# Patient Record
Sex: Male | Born: 1940
Health system: Southern US, Community
[De-identification: ages and names within clinical notes are randomized; demographics above are authoritative.]

## PROBLEM LIST (undated history)

## (undated) DIAGNOSIS — F419 Anxiety disorder, unspecified: Secondary | ICD-10-CM

## (undated) DIAGNOSIS — E119 Type 2 diabetes mellitus without complications: Secondary | ICD-10-CM

## (undated) DIAGNOSIS — Z87828 Personal history of other (healed) physical injury and trauma: Secondary | ICD-10-CM

## (undated) DIAGNOSIS — E039 Hypothyroidism, unspecified: Secondary | ICD-10-CM

## (undated) DIAGNOSIS — M1990 Unspecified osteoarthritis, unspecified site: Secondary | ICD-10-CM

## (undated) DIAGNOSIS — K219 Gastro-esophageal reflux disease without esophagitis: Secondary | ICD-10-CM

## (undated) DIAGNOSIS — I10 Essential (primary) hypertension: Secondary | ICD-10-CM

## (undated) DIAGNOSIS — C61 Malignant neoplasm of prostate: Secondary | ICD-10-CM

## (undated) DIAGNOSIS — I2510 Atherosclerotic heart disease of native coronary artery without angina pectoris: Secondary | ICD-10-CM

## (undated) DIAGNOSIS — Z860100 Personal history of colon polyps, unspecified: Secondary | ICD-10-CM

## (undated) DIAGNOSIS — L409 Psoriasis, unspecified: Secondary | ICD-10-CM

## (undated) HISTORY — DX: Anxiety disorder, unspecified: F41.9

## (undated) HISTORY — DX: Hypothyroidism, unspecified: E03.9

## (undated) HISTORY — DX: Psoriasis, unspecified: L40.9

## (undated) HISTORY — DX: Essential (primary) hypertension: I10

## (undated) HISTORY — DX: Type 2 diabetes mellitus without complications: E11.9

## (undated) HISTORY — DX: Malignant neoplasm of prostate: C61

## (undated) HISTORY — DX: Personal history of colon polyps, unspecified: Z86.0100

## (undated) HISTORY — DX: Personal history of other (healed) physical injury and trauma: Z87.828

## (undated) HISTORY — DX: Personal history of colonic polyps: Z86.010

## (undated) HISTORY — PX: OTHER SURGICAL HISTORY: SHX169

## (undated) HISTORY — DX: Gastro-esophageal reflux disease without esophagitis: K21.9

---

## 2005-10-09 ENCOUNTER — Ambulatory Visit: Admission: RE | Admit: 2005-10-09 | Discharge: 2006-01-07 | Payer: Self-pay | Admitting: Radiation Oncology

## 2005-10-16 ENCOUNTER — Encounter: Admission: RE | Admit: 2005-10-16 | Discharge: 2005-10-16 | Payer: Self-pay | Admitting: Urology

## 2005-11-27 ENCOUNTER — Ambulatory Visit (HOSPITAL_BASED_OUTPATIENT_CLINIC_OR_DEPARTMENT_OTHER): Admission: RE | Admit: 2005-11-27 | Discharge: 2005-11-27 | Payer: Self-pay | Admitting: Urology

## 2014-07-25 ENCOUNTER — Encounter (INDEPENDENT_AMBULATORY_CARE_PROVIDER_SITE_OTHER): Payer: Self-pay | Admitting: *Deleted

## 2014-07-27 ENCOUNTER — Other Ambulatory Visit (INDEPENDENT_AMBULATORY_CARE_PROVIDER_SITE_OTHER): Payer: Self-pay | Admitting: *Deleted

## 2014-07-27 ENCOUNTER — Encounter (INDEPENDENT_AMBULATORY_CARE_PROVIDER_SITE_OTHER): Payer: Self-pay | Admitting: *Deleted

## 2014-07-27 ENCOUNTER — Telehealth (INDEPENDENT_AMBULATORY_CARE_PROVIDER_SITE_OTHER): Payer: Self-pay | Admitting: *Deleted

## 2014-07-27 ENCOUNTER — Encounter (INDEPENDENT_AMBULATORY_CARE_PROVIDER_SITE_OTHER): Payer: Self-pay

## 2014-07-27 DIAGNOSIS — Z86010 Personal history of colonic polyps: Secondary | ICD-10-CM

## 2014-07-27 DIAGNOSIS — Z1211 Encounter for screening for malignant neoplasm of colon: Secondary | ICD-10-CM

## 2014-07-27 NOTE — Telephone Encounter (Signed)
Patient needs trilyte 

## 2014-07-28 MED ORDER — PEG 3350-KCL-NA BICARB-NACL 420 G PO SOLR: 4000.0000 mL | Freq: Once | ORAL | Status: DC

## 2014-08-09 ENCOUNTER — Telehealth (INDEPENDENT_AMBULATORY_CARE_PROVIDER_SITE_OTHER): Payer: Self-pay | Admitting: *Deleted

## 2014-08-09 NOTE — Telephone Encounter (Signed)
Referring MD/PCP: vyas   Procedure: tcs  Reason/Indication:  Hx polyps  Has patient had this procedure before?  Yes, 2011 -- scanned  If so, when, by whom and where?    Is there a family history of colon cancer?  no  Who?  What age when diagnosed?    Is patient diabetic?   no      Does patient have prosthetic heart valve?  no  Do you have a pacemaker?  no  Has patient ever had endocarditis? no  Has patient had joint replacement within last 12 months?  no  Does patient tend to be constipated or take laxatives? no  Is patient on Coumadin, Plavix and/or Aspirin? yes  Medications: asa 81 mg daily, omeprazole 40 mg daily, levothyroxine 137 mcg daily, clonazepam 0.5 mg prn, triamcinolone ointment bid  Allergies: celexa, cipro, bactrim, serax  Medication Adjustment: asa 2 days  Procedure date & time: 09/08/14 at 930

## 2014-08-10 NOTE — Telephone Encounter (Signed)
agree

## 2014-08-12 ENCOUNTER — Encounter: Payer: Self-pay | Admitting: Cardiology

## 2014-08-12 ENCOUNTER — Ambulatory Visit (INDEPENDENT_AMBULATORY_CARE_PROVIDER_SITE_OTHER): Payer: Medicare Other | Admitting: Cardiology

## 2014-08-12 VITALS — BP 140/80 | HR 69 | Ht 72.0 in | Wt 184.0 lb

## 2014-08-12 DIAGNOSIS — E119 Type 2 diabetes mellitus without complications: Secondary | ICD-10-CM | POA: Insufficient documentation

## 2014-08-12 DIAGNOSIS — R002 Palpitations: Secondary | ICD-10-CM

## 2014-08-12 DIAGNOSIS — I10 Essential (primary) hypertension: Secondary | ICD-10-CM | POA: Insufficient documentation
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
## 2014-08-12 NOTE — Patient Instructions (Signed)
Your physician has recommended that you wear a 7 day event monitor. Event monitors are medical devices that record the heart's electrical activity. Doctors most often [place] these monitors to diagnose arrhythmias. Arrhythmias are problems with the speed or rhythm of the heartbeat. The monitor is a small, portable device. You can wear one while you do your normal daily activities. This is usually used to diagnose what is causing palpitations/syncope (passing out). Office will contact with results via phone or letter.   Continue all current medications. Follow up based on results.

## 2014-08-16 ENCOUNTER — Other Ambulatory Visit: Payer: Self-pay | Admitting: *Deleted

## 2014-08-16 DIAGNOSIS — R002 Palpitations: Secondary | ICD-10-CM

## 2014-09-08 ENCOUNTER — Ambulatory Visit (HOSPITAL_COMMUNITY): Admission: RE | Admit: 2014-09-08 | Payer: Medicare Other | Source: Ambulatory Visit | Admitting: Internal Medicine

## 2014-09-08 ENCOUNTER — Encounter (HOSPITAL_COMMUNITY): Admission: RE | Payer: Self-pay | Source: Ambulatory Visit

## 2014-09-08 SURGERY — COLONOSCOPY
Anesthesia: Moderate Sedation

## 2015-07-28 ENCOUNTER — Encounter (INDEPENDENT_AMBULATORY_CARE_PROVIDER_SITE_OTHER): Payer: Self-pay | Admitting: *Deleted

## 2015-08-03 ENCOUNTER — Other Ambulatory Visit (INDEPENDENT_AMBULATORY_CARE_PROVIDER_SITE_OTHER): Payer: Self-pay | Admitting: *Deleted

## 2015-08-03 DIAGNOSIS — Z86010 Personal history of colonic polyps: Secondary | ICD-10-CM

## 2015-08-29 ENCOUNTER — Other Ambulatory Visit (INDEPENDENT_AMBULATORY_CARE_PROVIDER_SITE_OTHER): Payer: Self-pay | Admitting: *Deleted

## 2015-08-29 ENCOUNTER — Encounter (INDEPENDENT_AMBULATORY_CARE_PROVIDER_SITE_OTHER): Payer: Self-pay | Admitting: *Deleted

## 2015-08-29 MED ORDER — PEG 3350-KCL-NA BICARB-NACL 420 G PO SOLR: 4000.0000 mL | Freq: Once | ORAL | Status: DC

## 2015-08-29 NOTE — Telephone Encounter (Signed)
Patient needs trilyte 

## 2015-09-21 ENCOUNTER — Telehealth (INDEPENDENT_AMBULATORY_CARE_PROVIDER_SITE_OTHER): Payer: Self-pay | Admitting: *Deleted

## 2015-09-21 NOTE — Telephone Encounter (Signed)
Referring MD/PCP: vyas   Procedure: tcs  Reason/Indication:  Hx polyps  Has patient had this procedure before?  Yes, 2011 -- scanned  If so, when, by whom and where?    Is there a family history of colon cancer?  nop  Who?  What age when diagnosed?    Is patient diabetic?   no      Does patient have prosthetic heart valve or mechanical valve?  no  Do you have a pacemaker?  no  Has patient ever had endocarditis? no  Has patient had joint replacement within last 12 months?  no  Does patient tend to be constipated or take laxatives? no  Does patient have a history of alcohol/drug use?  no  Is patient on Coumadin, Plavix and/or Aspirin? no  Medications: levothyroxine 137 mcg daily  Allergies: serac, bactrim, celexa, cipro  Medication Adjustment:   Procedure date & time: 10/19/15 at 830

## 2015-09-21 NOTE — Telephone Encounter (Signed)
agree

## 2015-10-19 ENCOUNTER — Encounter (HOSPITAL_COMMUNITY): Payer: Self-pay | Admitting: *Deleted

## 2015-10-19 ENCOUNTER — Encounter (HOSPITAL_COMMUNITY)
Admission: RE | Disposition: A | Discharge status: Home / Self Care | Payer: Self-pay | Source: Ambulatory Visit | Attending: Internal Medicine

## 2015-10-19 ENCOUNTER — Ambulatory Visit (HOSPITAL_COMMUNITY)
Admission: RE | Admit: 2015-10-19 | Discharge: 2015-10-19 | Disposition: A | Discharge status: Home / Self Care | Payer: Medicare Other | Source: Ambulatory Visit | Attending: Internal Medicine | Admitting: Internal Medicine

## 2015-10-19 DIAGNOSIS — Z1211 Encounter for screening for malignant neoplasm of colon: Secondary | ICD-10-CM | POA: Diagnosis not present

## 2015-10-19 DIAGNOSIS — F419 Anxiety disorder, unspecified: Secondary | ICD-10-CM | POA: Insufficient documentation

## 2015-10-19 DIAGNOSIS — I10 Essential (primary) hypertension: Secondary | ICD-10-CM | POA: Insufficient documentation

## 2015-10-19 DIAGNOSIS — K219 Gastro-esophageal reflux disease without esophagitis: Secondary | ICD-10-CM | POA: Diagnosis not present

## 2015-10-19 DIAGNOSIS — Z79899 Other long term (current) drug therapy: Secondary | ICD-10-CM | POA: Diagnosis not present

## 2015-10-19 DIAGNOSIS — E039 Hypothyroidism, unspecified: Secondary | ICD-10-CM | POA: Diagnosis not present

## 2015-10-19 DIAGNOSIS — E119 Type 2 diabetes mellitus without complications: Secondary | ICD-10-CM | POA: Diagnosis not present

## 2015-10-19 DIAGNOSIS — Z8546 Personal history of malignant neoplasm of prostate: Secondary | ICD-10-CM | POA: Diagnosis not present

## 2015-10-19 DIAGNOSIS — Z86010 Personal history of colonic polyps: Secondary | ICD-10-CM

## 2015-10-19 DIAGNOSIS — K648 Other hemorrhoids: Secondary | ICD-10-CM | POA: Diagnosis not present

## 2015-10-19 DIAGNOSIS — Z09 Encounter for follow-up examination after completed treatment for conditions other than malignant neoplasm: Secondary | ICD-10-CM | POA: Diagnosis not present

## 2015-10-19 HISTORY — PX: COLONOSCOPY: SHX5424

## 2015-10-19 SURGERY — COLONOSCOPY
Anesthesia: Moderate Sedation

## 2015-10-19 MED ORDER — SODIUM CHLORIDE 0.9 % IV SOLN
INTRAVENOUS | Status: DC
  Administered 2015-10-19: 08:00:00 via INTRAVENOUS

## 2015-10-19 MED ORDER — MIDAZOLAM HCL 5 MG/5ML IJ SOLN
INTRAMUSCULAR | Status: AC
  Filled 2015-10-19: qty 10

## 2015-10-19 MED ORDER — MEPERIDINE HCL 50 MG/ML IJ SOLN
INTRAMUSCULAR | Status: AC
  Filled 2015-10-19: qty 1

## 2015-10-19 MED ORDER — MEPERIDINE HCL 50 MG/ML IJ SOLN
INTRAMUSCULAR | Status: DC | PRN
  Administered 2015-10-19 (×2): 25 mg via INTRAVENOUS

## 2015-10-19 MED ORDER — STERILE WATER FOR IRRIGATION IR SOLN
Status: DC | PRN
  Administered 2015-10-19: 09:00:00

## 2015-10-19 MED ORDER — MIDAZOLAM HCL 5 MG/5ML IJ SOLN
INTRAMUSCULAR | Status: DC | PRN
  Administered 2015-10-19 (×3): 2 mg via INTRAVENOUS

## 2015-10-19 NOTE — Discharge Instructions (Signed)
Resume usual medications and diet. No driving for 24 hours per   Colonoscopy, Care After These instructions give you information on caring for yourself after your procedure. Your doctor may also give you more specific instructions. Call your doctor if you have any problems or questions after your procedure. HOME CARE  Do not drive for 24 hours.  Do not sign important papers or use machinery for 24 hours.  You may shower.  You may go back to your usual activities, but go slower for the first 24 hours.  Take rest breaks often during the first 24 hours.  Walk around or use warm packs on your belly (abdomen) if you have belly cramping or gas.  Drink enough fluids to keep your pee (urine) clear or pale yellow.  Resume your normal diet. Avoid heavy or fried foods.  Avoid drinking alcohol for 24 hours or as told by your doctor.  Only take medicines as told by your doctor. If a tissue sample (biopsy) was taken during the procedure:   Do not take aspirin or blood thinners for 7 days, or as told by your doctor.  Do not drink alcohol for 7 days, or as told by your doctor.  Eat soft foods for the first 24 hours. GET HELP IF: You still have a small amount of blood in your poop (stool) 2-3 days after the procedure. GET HELP RIGHT AWAY IF:  You have more than a small amount of blood in your poop.  You see clumps of tissue (blood clots) in your poop.  Your belly is puffy (swollen).  You feel sick to your stomach (nauseous) or throw up (vomit).  You have a fever.  You have belly pain that gets worse and medicine does not help. MAKE SURE YOU:  Understand these instructions.  Will watch your condition.  Will get help right away if you are not doing well or get worse.   This information is not intended to replace advice given to you by your health care provider. Make sure you discuss any questions you have with your health care provider.   Document Released: 07/13/2010 Document  Revised: 06/15/2013 Document Reviewed: 02/15/2013 Elsevier Interactive Patient Education 2016 [employer].   Hemorrhoids Hemorrhoids are swollen veins around the rectum or anus. There are two types of hemorrhoids:   Internal hemorrhoids. These occur in the veins just inside the rectum. They may poke through to the outside and become irritated and painful.  External hemorrhoids. These occur in the veins outside the anus and can be felt as a painful swelling or hard lump near the anus. CAUSES  Pregnancy.   Obesity.   Constipation or diarrhea.   Straining to have a bowel movement.   Sitting for long periods on the toilet.  Heavy lifting or other activity that caused you to strain.  Anal intercourse. SYMPTOMS   Pain.   Anal itching or irritation.   Rectal bleeding.   Fecal leakage.   Anal swelling.   One or more lumps around the anus.  DIAGNOSIS  Your caregiver may be able to diagnose hemorrhoids by visual examination. Other examinations or tests that may be performed include:   Examination of the rectal area with a gloved hand (digital rectal exam).   Examination of anal canal using a small tube (scope).   A blood test if you have lost a significant amount of blood.  A test to look inside the colon (sigmoidoscopy or colonoscopy). TREATMENT Most hemorrhoids can be treated at home.  However, if symptoms do not seem to be getting better or if you have a lot of rectal bleeding, your caregiver may perform a procedure to help make the hemorrhoids get smaller or remove them completely. Possible treatments include:   Placing a rubber band at the base of the hemorrhoid to cut off the circulation (rubber band ligation).   Injecting a chemical to shrink the hemorrhoid (sclerotherapy).   Using a tool to burn the hemorrhoid (infrared light therapy).   Surgically removing the hemorrhoid (hemorrhoidectomy).   Stapling the hemorrhoid to block blood flow to the  tissue (hemorrhoid stapling).  HOME CARE INSTRUCTIONS   Eat foods with fiber, such as whole grains, beans, nuts, fruits, and vegetables. Ask your doctor about taking products with added fiber in them (fibersupplements).  Increase fluid intake. Drink enough water and fluids to keep your urine clear or pale yellow.   Exercise regularly.   Go to the bathroom when you have the urge to have a bowel movement. Do not wait.   Avoid straining to have bowel movements.   Keep the anal area dry and clean. Use wet toilet paper or moist towelettes after a bowel movement.   Medicated creams and suppositories may be used or applied as directed.   Only take over-the-counter or prescription medicines as directed by your caregiver.   Take warm sitz baths for 15-20 minutes, 3-4 times a day to ease pain and discomfort.   Place ice packs on the hemorrhoids if they are tender and swollen. Using ice packs between sitz baths may be helpful.   Put ice in a plastic bag.   Place a towel between your skin and the bag.   Leave the ice on for 15-20 minutes, 3-4 times a day.   Do not use a donut-shaped pillow or sit on the toilet for long periods. This increases blood pooling and pain.  SEEK MEDICAL CARE IF:  You have increasing pain and swelling that is not controlled by treatment or medicine.  You have uncontrolled bleeding.  You have difficulty or you are unable to have a bowel movement.  You have pain or inflammation outside the area of the hemorrhoids. MAKE SURE YOU:  Understand these instructions.  Will watch your condition.  Will get help right away if you are not doing well or get worse.   This information is not intended to replace advice given to you by your health care provider. Make sure you discuss any questions you have with your health care provider.   Document Released: 06/07/2000 Document Revised: 05/27/2012 Document Reviewed: 04/14/2012 Elsevier Interactive Patient  Education [employer].

## 2015-10-19 NOTE — Op Note (Signed)
[facility] Patient Name: Herbert Carr Procedure Date: 10/19/2015 8:52 AM MRN: [id number] Date of Birth: 1941-01-11 Attending MD: Hildred Laser , MD CSN: [id number] Age: 75 Admit Type: Outpatient Procedure:                Colonoscopy Indications:              Surveillance: Personal history of adenomatous                            polyps on last colonoscopy > 5 years ago Providers:                Hildred Laser, MD, Renda Rolls, RN, Isabella Stalling,                            Technician Referring MD:             Glenda Chroman Medicines:                Meperidine 50 mg IV, Midazolam 6 mg IV Complications:            No immediate complications. Estimated Blood Loss:     Estimated blood loss: none. Procedure:                Pre-Anesthesia Assessment:                           - Prior to the procedure, a History and Physical                            was performed, and patient medications and                            allergies were reviewed. The patient's tolerance of                            previous anesthesia was also reviewed. The risks                            and benefits of the procedure and the sedation                            options and risks were discussed with the patient.                            All questions were answered, and informed consent                            was obtained. Prior Anticoagulants: The patient has                            taken no previous anticoagulant or antiplatelet                            agents. ASA Grade Assessment: II - A patient with  mild systemic disease. After reviewing the risks                            and benefits, the patient was deemed in                            satisfactory condition to undergo the procedure.                           After obtaining informed consent, the colonoscope                            was passed under direct vision. Throughout the   procedure, the patient's blood pressure, pulse, and                            oxygen saturations were monitored continuously. The                            [id number]) scope was introduced through                            the anus and advanced to the the cecum, identified                            by appendiceal orifice and ileocecal valve. The                            colonoscopy was performed without difficulty. The                            patient tolerated the procedure well. The quality                            of the bowel preparation was adequate. The                            ileocecal valve, appendiceal orifice, and rectum                            were photographed. Scope In: 9:00:54 AM Scope Out: 9:16:58 AM Scope Withdrawal Time: 0 hours 9 minutes 9 seconds  Total Procedure Duration: 0 hours 16 minutes 4 seconds  Findings:      The colon (entire examined portion) appeared normal.      Internal hemorrhoids were found during retroflexion. The hemorrhoids       were medium-sized.      The digital rectal exam was normal. Impression:               - The entire examined colon is normal.                           - Internal hemorrhoids.                           - No  specimens collected. Moderate Sedation:      Moderate (conscious) sedation was administered by the endoscopy nurse       and supervised by the endoscopist. The following parameters were       monitored: oxygen saturation, heart rate, blood pressure, CO2       capnography and response to care. Total physician intraservice time was       21 minutes. Recommendation:           - Patient has a contact number available for                            emergencies. The signs and symptoms of potential                            delayed complications were discussed with the                            patient. Return to normal activities tomorrow.                            Written discharge instructions  were provided to the                            patient.                           - Resume previous diet today.                           - Continue present medications.                           - Repeat colonoscopy is not recommended for                            surveillance. Procedure Code(s):        --- Professional ---                           [id number], Colonoscopy, flexible; diagnostic, including                            collection of specimen(s) by brushing or washing,                            when performed (separate procedure)                           99152, Moderate sedation services provided by the                            same physician or other qualified health care                            professional performing the diagnostic or  therapeutic service that the sedation supports,                            requiring the presence of an independent trained                            observer to assist in the monitoring of the                            patient's level of consciousness and physiological                            status; initial 15 minutes of intraservice time,                            patient age 67 years or older Diagnosis Code(s):        --- Professional ---                           Z86.010, Personal history of colonic polyps                           K64.8, Other hemorrhoids CPT copyright 2016 American Medical Association. All rights reserved. The codes documented in this report are preliminary and upon coder review may  be revised to meet current compliance requirements. Hildred Laser, MD Hildred Laser, MD 10/19/2015 9:26:42 AM This report has been signed electronically. Number of Addenda: 0
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
## 2015-10-23 ENCOUNTER — Encounter (HOSPITAL_COMMUNITY): Payer: Self-pay | Admitting: Internal Medicine

## 2017-03-22 ENCOUNTER — Emergency Department (HOSPITAL_COMMUNITY): Payer: Medicare HMO

## 2017-03-22 ENCOUNTER — Emergency Department (HOSPITAL_COMMUNITY)
Admission: EM | Admit: 2017-03-22 | Discharge: 2017-03-22 | Disposition: A | Discharge status: Home / Self Care | Payer: Medicare HMO | Source: Home / Self Care | Attending: Emergency Medicine | Admitting: Emergency Medicine

## 2017-03-22 ENCOUNTER — Encounter (HOSPITAL_COMMUNITY): Payer: Self-pay

## 2017-03-22 DIAGNOSIS — E039 Hypothyroidism, unspecified: Secondary | ICD-10-CM | POA: Insufficient documentation

## 2017-03-22 DIAGNOSIS — Z8546 Personal history of malignant neoplasm of prostate: Secondary | ICD-10-CM | POA: Insufficient documentation

## 2017-03-22 DIAGNOSIS — R0789 Other chest pain: Secondary | ICD-10-CM | POA: Insufficient documentation

## 2017-03-22 DIAGNOSIS — E119 Type 2 diabetes mellitus without complications: Secondary | ICD-10-CM | POA: Insufficient documentation

## 2017-03-22 DIAGNOSIS — I10 Essential (primary) hypertension: Secondary | ICD-10-CM | POA: Insufficient documentation

## 2017-03-22 DIAGNOSIS — I214 Non-ST elevation (NSTEMI) myocardial infarction: Secondary | ICD-10-CM | POA: Diagnosis not present

## 2017-03-22 LAB — CBC WITH DIFFERENTIAL/PLATELET
Basophils Absolute: 0.1 10*3/uL (ref 0.0–0.1)
Basophils Relative: 1 %
EOS ABS: 0.4 10*3/uL (ref 0.0–0.7)
Eosinophils Relative: 7 %
HCT: 42.5 % (ref 39.0–52.0)
HEMOGLOBIN: 14.7 g/dL (ref 13.0–17.0)
LYMPHS ABS: 1.6 10*3/uL (ref 0.7–4.0)
Lymphocytes Relative: 28 %
MCH: 31.3 pg (ref 26.0–34.0)
MCHC: 34.6 g/dL (ref 30.0–36.0)
MCV: 90.4 fL (ref 78.0–100.0)
MONOS PCT: 8 %
Monocytes Absolute: 0.5 10*3/uL (ref 0.1–1.0)
NEUTROS PCT: 56 %
Neutro Abs: 3.2 10*3/uL (ref 1.7–7.7)
Platelets: 153 10*3/uL (ref 150–400)
RBC: 4.7 MIL/uL (ref 4.22–5.81)
RDW: 12.4 % (ref 11.5–15.5)
WBC: 5.7 10*3/uL (ref 4.0–10.5)

## 2017-03-22 LAB — COMPREHENSIVE METABOLIC PANEL
ALK PHOS: 63 U/L (ref 38–126)
ALT: 28 U/L (ref 17–63)
ANION GAP: 8 (ref 5–15)
AST: 27 U/L (ref 15–41)
Albumin: 4.5 g/dL (ref 3.5–5.0)
BILIRUBIN TOTAL: 0.8 mg/dL (ref 0.3–1.2)
BUN: 12 mg/dL (ref 6–20)
CALCIUM: 9.3 mg/dL (ref 8.9–10.3)
CO2: 28 mmol/L (ref 22–32)
Chloride: 106 mmol/L (ref 101–111)
Creatinine, Ser: 0.68 mg/dL (ref 0.61–1.24)
GFR calc non Af Amer: >60 mL/min (ref >60)
Glucose, Bld: 123 mg/dL — ABNORMAL HIGH (ref 65–99)
Potassium: 3.1 mmol/L — ABNORMAL LOW (ref 3.5–5.1)
SODIUM: 142 mmol/L (ref 135–145)
TOTAL PROTEIN: 8 g/dL (ref 6.5–8.1)

## 2017-03-22 LAB — TROPONIN I: Troponin I: <0.03 ng/mL (ref <0.03)

## 2017-03-22 LAB — LIPASE, BLOOD: LIPASE: 29 U/L (ref 11–51)

## 2017-03-22 NOTE — ED Triage Notes (Signed)
Pt states he has had mid sternal chest pain for a few days, saw his pmd yesterday and was given ranitidine.  Pt states the pain is worse today and is going through to his back.  Pt reports he was sweaty this evening when the pain worsened

## 2017-03-22 NOTE — Discharge Instructions (Signed)
Continue the zantac as prescribed by your doctor. You can also take simethicone (gaviscon) OTC for gas and bloating. Look at the diet recommendations and avoid things that can make you worse. Call the Radiology Schedule number to get an outpatient ultrasound of your gall bladder scheduled.  Call Dr Olevia Perches office on Monday to get an appointment to have him evaluate your pain. You may have a gallbladder problem or duodenal ulcer problem. If you get chest pain while exercising or if the pain lasts more than 30 minutes, you should call 911.
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
## 2017-03-25 ENCOUNTER — Emergency Department (HOSPITAL_COMMUNITY): Payer: Medicare HMO

## 2017-03-25 ENCOUNTER — Inpatient Hospital Stay (HOSPITAL_COMMUNITY)
Admission: EM | Admit: 2017-03-25 | Discharge: 2017-03-27 | DRG: 247 | Disposition: A | Discharge status: Home / Self Care | Payer: Medicare HMO | Attending: Cardiology | Admitting: Cardiology

## 2017-03-25 ENCOUNTER — Encounter (HOSPITAL_COMMUNITY): Payer: Self-pay | Admitting: Emergency Medicine

## 2017-03-25 DIAGNOSIS — E039 Hypothyroidism, unspecified: Secondary | ICD-10-CM | POA: Diagnosis present

## 2017-03-25 DIAGNOSIS — H5462 Unqualified visual loss, left eye, normal vision right eye: Secondary | ICD-10-CM | POA: Diagnosis present

## 2017-03-25 DIAGNOSIS — E119 Type 2 diabetes mellitus without complications: Secondary | ICD-10-CM | POA: Diagnosis present

## 2017-03-25 DIAGNOSIS — Z951 Presence of aortocoronary bypass graft: Secondary | ICD-10-CM

## 2017-03-25 DIAGNOSIS — Z8546 Personal history of malignant neoplasm of prostate: Secondary | ICD-10-CM

## 2017-03-25 DIAGNOSIS — K219 Gastro-esophageal reflux disease without esophagitis: Secondary | ICD-10-CM | POA: Diagnosis present

## 2017-03-25 DIAGNOSIS — Z923 Personal history of irradiation: Secondary | ICD-10-CM

## 2017-03-25 DIAGNOSIS — F419 Anxiety disorder, unspecified: Secondary | ICD-10-CM | POA: Diagnosis present

## 2017-03-25 DIAGNOSIS — R002 Palpitations: Secondary | ICD-10-CM | POA: Diagnosis present

## 2017-03-25 DIAGNOSIS — I341 Nonrheumatic mitral (valve) prolapse: Secondary | ICD-10-CM | POA: Diagnosis not present

## 2017-03-25 DIAGNOSIS — I10 Essential (primary) hypertension: Secondary | ICD-10-CM | POA: Diagnosis present

## 2017-03-25 DIAGNOSIS — Z9689 Presence of other specified functional implants: Secondary | ICD-10-CM | POA: Diagnosis present

## 2017-03-25 DIAGNOSIS — E038 Other specified hypothyroidism: Secondary | ICD-10-CM | POA: Diagnosis not present

## 2017-03-25 DIAGNOSIS — Z833 Family history of diabetes mellitus: Secondary | ICD-10-CM

## 2017-03-25 DIAGNOSIS — Z79899 Other long term (current) drug therapy: Secondary | ICD-10-CM | POA: Diagnosis not present

## 2017-03-25 DIAGNOSIS — Z7952 Long term (current) use of systemic steroids: Secondary | ICD-10-CM

## 2017-03-25 DIAGNOSIS — R9431 Abnormal electrocardiogram [ECG] [EKG]: Secondary | ICD-10-CM | POA: Diagnosis not present

## 2017-03-25 DIAGNOSIS — I2510 Atherosclerotic heart disease of native coronary artery without angina pectoris: Secondary | ICD-10-CM | POA: Diagnosis present

## 2017-03-25 DIAGNOSIS — I214 Non-ST elevation (NSTEMI) myocardial infarction: Secondary | ICD-10-CM | POA: Diagnosis present

## 2017-03-25 DIAGNOSIS — Z955 Presence of coronary angioplasty implant and graft: Secondary | ICD-10-CM

## 2017-03-25 DIAGNOSIS — Z8249 Family history of ischemic heart disease and other diseases of the circulatory system: Secondary | ICD-10-CM | POA: Diagnosis not present

## 2017-03-25 HISTORY — DX: Unspecified osteoarthritis, unspecified site: M19.90

## 2017-03-25 HISTORY — DX: Atherosclerotic heart disease of native coronary artery without angina pectoris: I25.10

## 2017-03-25 LAB — CBC
HEMATOCRIT: 41 % (ref 39.0–52.0)
Hemoglobin: 14 g/dL (ref 13.0–17.0)
MCH: 31 pg (ref 26.0–34.0)
MCHC: 34.1 g/dL (ref 30.0–36.0)
MCV: 90.7 fL (ref 78.0–100.0)
Platelets: 184 10*3/uL (ref 150–400)
RBC: 4.52 MIL/uL (ref 4.22–5.81)
RDW: 12.6 % (ref 11.5–15.5)
WBC: 16.2 10*3/uL — AB (ref 4.0–10.5)

## 2017-03-25 LAB — BASIC METABOLIC PANEL
ANION GAP: 5 (ref 5–15)
BUN: 9 mg/dL (ref 6–20)
CO2: 29 mmol/L (ref 22–32)
Calcium: 9.1 mg/dL (ref 8.9–10.3)
Chloride: 100 mmol/L — ABNORMAL LOW (ref 101–111)
Creatinine, Ser: 0.73 mg/dL (ref 0.61–1.24)
Glucose, Bld: 142 mg/dL — ABNORMAL HIGH (ref 65–99)
POTASSIUM: 3.8 mmol/L (ref 3.5–5.1)
Sodium: 134 mmol/L — ABNORMAL LOW (ref 135–145)

## 2017-03-25 LAB — I-STAT TROPONIN, ED: Troponin i, poc: 4.32 ng/mL (ref 0.00–0.08)

## 2017-03-25 MED ORDER — HEPARIN (PORCINE) IN NACL 100-0.45 UNIT/ML-% IJ SOLN
1200.0000 [IU]/h | INTRAMUSCULAR | Status: DC
  Administered 2017-03-25: 950 [IU]/h via INTRAVENOUS
  Administered 2017-03-26: 1200 [IU]/h via INTRAVENOUS
  Filled 2017-03-25: qty 250

## 2017-03-25 MED ORDER — IOPAMIDOL (ISOVUE-370) INJECTION 76%
INTRAVENOUS | Status: AC
  Filled 2017-03-25: qty 100

## 2017-03-25 MED ORDER — IOPAMIDOL (ISOVUE-370) INJECTION 76%
INTRAVENOUS | Status: AC
  Administered 2017-03-25: 100 mL
  Filled 2017-03-25: qty 100

## 2017-03-25 MED ORDER — ASPIRIN 81 MG PO CHEW
324.0000 mg | CHEWABLE_TABLET | Freq: Once | ORAL | Status: AC
Start: 2017-03-25 — End: 2017-03-25
  Administered 2017-03-25: 324 mg via ORAL
  Filled 2017-03-25: qty 4

## 2017-03-25 MED ORDER — HEPARIN BOLUS VIA INFUSION
4000.0000 [IU] | Freq: Once | INTRAVENOUS | Status: AC
  Administered 2017-03-25: 4000 [IU] via INTRAVENOUS
  Filled 2017-03-25: qty 4000

## 2017-03-25 NOTE — ED Notes (Signed)
Troponin = 4.32. Dr. Thomasene Lot notified.
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
## 2017-03-25 NOTE — ED Notes (Signed)
Pt. Returned from CT via stretcher. 

## 2017-03-25 NOTE — ED Notes (Signed)
Pt. To CT via stretcher. 

## 2017-03-25 NOTE — ED Triage Notes (Signed)
Pt to ED with c/o central chest pan and mid upper back pain off and on x's 2 weeks.  St's pain increases after eating.  Pt also st's he is belching a lot.

## 2017-03-25 NOTE — Progress Notes (Signed)
ANTICOAGULATION CONSULT NOTE  Pharmacy Consult for heparin Indication: chest pain/ACS  Heparin Dosing Weight: 77.1 kg   Assessment: 47 yom presenting with CP. Pharmacy consulted to dose heparin for ACS. Not on anticoagulation PTA. CBC wnl. No bleed documented.  Goal of Therapy:  Heparin level 0.3-0.7 units/ml Monitor platelets by anticoagulation protocol: Yes   Plan:  Heparin 4000 unit bolus Start heparin at 950 units/h 8h heparin level Daily heparin level/CBC Monitor s/sx bleeding   Elicia Lamp, PharmD, BCPS Clinical Pharmacist 03/25/2017 10:23 PM

## 2017-03-26 ENCOUNTER — Other Ambulatory Visit: Payer: Self-pay

## 2017-03-26 ENCOUNTER — Encounter (HOSPITAL_COMMUNITY): Payer: Self-pay | Admitting: Cardiovascular Disease

## 2017-03-26 ENCOUNTER — Inpatient Hospital Stay (HOSPITAL_COMMUNITY): Payer: Medicare HMO

## 2017-03-26 ENCOUNTER — Inpatient Hospital Stay (HOSPITAL_COMMUNITY)
Admission: EM | Disposition: A | Discharge status: Home / Self Care | Payer: Self-pay | Source: Home / Self Care | Attending: Cardiology

## 2017-03-26 DIAGNOSIS — E038 Other specified hypothyroidism: Secondary | ICD-10-CM

## 2017-03-26 DIAGNOSIS — K219 Gastro-esophageal reflux disease without esophagitis: Secondary | ICD-10-CM | POA: Diagnosis present

## 2017-03-26 DIAGNOSIS — I214 Non-ST elevation (NSTEMI) myocardial infarction: Principal | ICD-10-CM

## 2017-03-26 DIAGNOSIS — R002 Palpitations: Secondary | ICD-10-CM

## 2017-03-26 DIAGNOSIS — R9431 Abnormal electrocardiogram [ECG] [EKG]: Secondary | ICD-10-CM

## 2017-03-26 DIAGNOSIS — I10 Essential (primary) hypertension: Secondary | ICD-10-CM

## 2017-03-26 DIAGNOSIS — I341 Nonrheumatic mitral (valve) prolapse: Secondary | ICD-10-CM

## 2017-03-26 DIAGNOSIS — E039 Hypothyroidism, unspecified: Secondary | ICD-10-CM | POA: Diagnosis present

## 2017-03-26 HISTORY — PX: LEFT HEART CATH AND CORONARY ANGIOGRAPHY: CATH118249

## 2017-03-26 HISTORY — PX: CORONARY STENT INTERVENTION: CATH118234

## 2017-03-26 LAB — ECHOCARDIOGRAM COMPLETE
AOASC: 36 cm
CHL CUP MV DEC (S): 275
E decel time: 275 msec
FS: 38 % (ref 28–44)
Height: 72 in
IVS/LV PW RATIO, ED: 1.3
LA diam index: 1.99 cm/m2
LA vol A4C: 53.9 ml
LA vol index: 31.2 mL/m2
LA vol: 62.7 mL
LASIZE: 40 mm
LDCA: 3.14 cm2
LEFT ATRIUM END SYS DIAM: 40 mm
LV e' LATERAL: 8.92 cm/s
LVOTD: 20 mm
MV pk E vel: 1.1 m/s
PW: 10 mm — AB (ref 0.6–1.1)
RV LATERAL S' VELOCITY: 11.9 cm/s
TAPSE: 22.3 mm
TDI e' lateral: 8.92
TDI e' medial: 7.18
Weight: 2798.4 oz

## 2017-03-26 LAB — CBC
HEMATOCRIT: 39.9 % (ref 39.0–52.0)
Hemoglobin: 13.7 g/dL (ref 13.0–17.0)
MCH: 31.2 pg (ref 26.0–34.0)
MCHC: 34.3 g/dL (ref 30.0–36.0)
MCV: 90.9 fL (ref 78.0–100.0)
PLATELETS: 184 10*3/uL (ref 150–400)
RBC: 4.39 MIL/uL (ref 4.22–5.81)
RDW: 12.7 % (ref 11.5–15.5)
WBC: 14.5 10*3/uL — ABNORMAL HIGH (ref 4.0–10.5)

## 2017-03-26 LAB — COMPREHENSIVE METABOLIC PANEL
ALK PHOS: 59 U/L (ref 38–126)
ALT: 36 U/L (ref 17–63)
AST: 119 U/L — ABNORMAL HIGH (ref 15–41)
Albumin: 3.7 g/dL (ref 3.5–5.0)
Anion gap: 11 (ref 5–15)
BILIRUBIN TOTAL: 0.7 mg/dL (ref 0.3–1.2)
BUN: 7 mg/dL (ref 6–20)
CALCIUM: 8.8 mg/dL — AB (ref 8.9–10.3)
CO2: 28 mmol/L (ref 22–32)
CREATININE: 0.71 mg/dL (ref 0.61–1.24)
Chloride: 99 mmol/L — ABNORMAL LOW (ref 101–111)
Glucose, Bld: 126 mg/dL — ABNORMAL HIGH (ref 65–99)
Potassium: 3.3 mmol/L — ABNORMAL LOW (ref 3.5–5.1)
Sodium: 138 mmol/L (ref 135–145)
Total Protein: 6.8 g/dL (ref 6.5–8.1)

## 2017-03-26 LAB — BRAIN NATRIURETIC PEPTIDE: B NATRIURETIC PEPTIDE 5: 265.8 pg/mL — AB (ref 0.0–100.0)

## 2017-03-26 LAB — TSH: TSH: 1.438 u[IU]/mL (ref 0.350–4.500)

## 2017-03-26 LAB — LIPID PANEL
Cholesterol: 117 mg/dL (ref 0–200)
HDL: 36 mg/dL — AB (ref >40)
LDL CALC: 69 mg/dL (ref 0–99)
Total CHOL/HDL Ratio: 3.3 RATIO
Triglycerides: 58 mg/dL (ref <150)
VLDL: 12 mg/dL (ref 0–40)

## 2017-03-26 LAB — GLUCOSE, CAPILLARY: Glucose-Capillary: 136 mg/dL — ABNORMAL HIGH (ref 65–99)

## 2017-03-26 LAB — HEPARIN LEVEL (UNFRACTIONATED): HEPARIN UNFRACTIONATED: 0.11 [IU]/mL — AB (ref 0.30–0.70)

## 2017-03-26 LAB — PROTIME-INR
INR: 1.11
PROTHROMBIN TIME: 14.3 s (ref 11.4–15.2)

## 2017-03-26 LAB — TROPONIN I
TROPONIN I: 9.37 ng/mL — AB (ref <0.03)
Troponin I: >65 ng/mL (ref <0.03)

## 2017-03-26 LAB — POCT ACTIVATED CLOTTING TIME: ACTIVATED CLOTTING TIME: 340 s

## 2017-03-26 LAB — HEMOGLOBIN A1C
Hgb A1c MFr Bld: 5.3 % (ref 4.8–5.6)
Mean Plasma Glucose: 105.41 mg/dL

## 2017-03-26 SURGERY — LEFT HEART CATH AND CORONARY ANGIOGRAPHY
Anesthesia: LOCAL

## 2017-03-26 MED ORDER — METOPROLOL TARTRATE 12.5 MG HALF TABLET
12.5000 mg | ORAL_TABLET | Freq: Two times a day (BID) | ORAL | Status: DC
  Administered 2017-03-26 – 2017-03-27 (×2): 12.5 mg via ORAL
  Filled 2017-03-26 (×2): qty 1

## 2017-03-26 MED ORDER — MIDAZOLAM HCL 2 MG/2ML IJ SOLN
INTRAMUSCULAR | Status: AC
  Filled 2017-03-26: qty 2

## 2017-03-26 MED ORDER — HEPARIN BOLUS VIA INFUSION
2500.0000 [IU] | Freq: Once | INTRAVENOUS | Status: AC
  Administered 2017-03-26: 2500 [IU] via INTRAVENOUS
  Filled 2017-03-26: qty 2500

## 2017-03-26 MED ORDER — VITAMIN B-12 1000 MCG PO TABS
1000.0000 ug | ORAL_TABLET | Freq: Every day | ORAL | Status: DC
  Administered 2017-03-26 – 2017-03-27 (×2): 1000 ug via ORAL
  Filled 2017-03-26 (×2): qty 1

## 2017-03-26 MED ORDER — SODIUM CHLORIDE 0.9% FLUSH: 3.0000 mL | Freq: Two times a day (BID) | INTRAVENOUS | Status: DC

## 2017-03-26 MED ORDER — HEPARIN SODIUM (PORCINE) 1000 UNIT/ML IJ SOLN
INTRAMUSCULAR | Status: AC
  Filled 2017-03-26: qty 1

## 2017-03-26 MED ORDER — ALUM HYDROXIDE-MAG CARBONATE 95-358 MG/15ML PO SUSP
15.0000 mL | ORAL | Status: DC | PRN
  Filled 2017-03-26: qty 15

## 2017-03-26 MED ORDER — SODIUM CHLORIDE 0.9% FLUSH: 3.0000 mL | INTRAVENOUS | Status: DC | PRN

## 2017-03-26 MED ORDER — VERAPAMIL HCL 2.5 MG/ML IV SOLN
INTRAVENOUS | Status: DC | PRN
  Administered 2017-03-26: 10 mL via INTRA_ARTERIAL

## 2017-03-26 MED ORDER — SODIUM CHLORIDE 0.9 % IV SOLN: 250.0000 mL | INTRAVENOUS | Status: DC | PRN

## 2017-03-26 MED ORDER — NITROGLYCERIN 1 MG/10 ML FOR IR/CATH LAB
INTRA_ARTERIAL | Status: AC
  Filled 2017-03-26: qty 10

## 2017-03-26 MED ORDER — POTASSIUM CHLORIDE CRYS ER 20 MEQ PO TBCR
40.0000 meq | EXTENDED_RELEASE_TABLET | Freq: Once | ORAL | Status: AC
  Administered 2017-03-26: 40 meq via ORAL
  Filled 2017-03-26: qty 2

## 2017-03-26 MED ORDER — SODIUM CHLORIDE 0.9 % WEIGHT BASED INFUSION: 1.0000 mL/kg/h | INTRAVENOUS | Status: DC

## 2017-03-26 MED ORDER — HEPARIN SODIUM (PORCINE) 1000 UNIT/ML IJ SOLN
INTRAMUSCULAR | Status: DC | PRN
Start: 2017-03-26 — End: 2017-03-26
  Administered 2017-03-26: 10 [IU] via INTRAVENOUS

## 2017-03-26 MED ORDER — HEART ATTACK BOUNCING BOOK
Freq: Once | Status: AC
  Administered 2017-03-26: 20:00:00
  Filled 2017-03-26: qty 1

## 2017-03-26 MED ORDER — ASPIRIN 81 MG PO CHEW: 81.0000 mg | CHEWABLE_TABLET | ORAL | Status: AC

## 2017-03-26 MED ORDER — TICAGRELOR 90 MG PO TABS
90.0000 mg | ORAL_TABLET | Freq: Two times a day (BID) | ORAL | Status: DC
  Administered 2017-03-26 – 2017-03-27 (×2): 90 mg via ORAL
  Filled 2017-03-26 (×2): qty 1

## 2017-03-26 MED ORDER — LEVOTHYROXINE SODIUM 75 MCG PO TABS
150.0000 ug | ORAL_TABLET | Freq: Every day | ORAL | Status: DC
  Administered 2017-03-26 – 2017-03-27 (×2): 150 ug via ORAL
  Filled 2017-03-26: qty 2
  Filled 2017-03-26: qty 1
  Filled 2017-03-26: qty 2

## 2017-03-26 MED ORDER — ATORVASTATIN CALCIUM 80 MG PO TABS
80.0000 mg | ORAL_TABLET | Freq: Every day | ORAL | Status: DC
  Administered 2017-03-26: 80 mg via ORAL
  Filled 2017-03-26: qty 1

## 2017-03-26 MED ORDER — LABETALOL HCL 5 MG/ML IV SOLN: 10.0000 mg | INTRAVENOUS | Status: AC | PRN

## 2017-03-26 MED ORDER — IOPAMIDOL (ISOVUE-370) INJECTION 76%
INTRAVENOUS | Status: AC
  Filled 2017-03-26: qty 100

## 2017-03-26 MED ORDER — FENTANYL CITRATE (PF) 100 MCG/2ML IJ SOLN
INTRAMUSCULAR | Status: AC
  Filled 2017-03-26: qty 2

## 2017-03-26 MED ORDER — SODIUM CHLORIDE 0.9% FLUSH
3.0000 mL | Freq: Two times a day (BID) | INTRAVENOUS | Status: DC
  Administered 2017-03-26: 3 mL via INTRAVENOUS

## 2017-03-26 MED ORDER — HEPARIN (PORCINE) IN NACL 2-0.9 UNIT/ML-% IJ SOLN
INTRAMUSCULAR | Status: AC
  Filled 2017-03-26: qty 1000

## 2017-03-26 MED ORDER — SODIUM CHLORIDE 0.9 % IV SOLN: INTRAVENOUS | Status: AC

## 2017-03-26 MED ORDER — FENTANYL CITRATE (PF) 100 MCG/2ML IJ SOLN
INTRAMUSCULAR | Status: DC | PRN
  Administered 2017-03-26 (×2): 25 ug via INTRAVENOUS

## 2017-03-26 MED ORDER — TICAGRELOR 90 MG PO TABS
ORAL_TABLET | ORAL | Status: DC | PRN
  Administered 2017-03-26: 180 mg via ORAL

## 2017-03-26 MED ORDER — IOPAMIDOL (ISOVUE-370) INJECTION 76%
INTRAVENOUS | Status: DC | PRN
  Administered 2017-03-26: 130 mL via INTRA_ARTERIAL

## 2017-03-26 MED ORDER — VERAPAMIL HCL 2.5 MG/ML IV SOLN
INTRAVENOUS | Status: AC
  Filled 2017-03-26: qty 2

## 2017-03-26 MED ORDER — SODIUM CHLORIDE 0.9 % WEIGHT BASED INFUSION: 3.0000 mL/kg/h | INTRAVENOUS | Status: DC

## 2017-03-26 MED ORDER — PREDNISONE 5 MG PO TABS
10.0000 mg | ORAL_TABLET | Freq: Two times a day (BID) | ORAL | Status: DC
Start: 2017-03-26 — End: 2017-03-27
  Administered 2017-03-26 – 2017-03-27 (×3): 10 mg via ORAL
  Filled 2017-03-26: qty 1
  Filled 2017-03-26 (×2): qty 2

## 2017-03-26 MED ORDER — LIDOCAINE HCL 2 % IJ SOLN
INTRAMUSCULAR | Status: AC
  Filled 2017-03-26: qty 10

## 2017-03-26 MED ORDER — MIDAZOLAM HCL 2 MG/2ML IJ SOLN
INTRAMUSCULAR | Status: DC | PRN
Start: 2017-03-26 — End: 2017-03-26
  Administered 2017-03-26 (×2): 1 mg via INTRAVENOUS

## 2017-03-26 MED ORDER — HEPARIN (PORCINE) IN NACL 2-0.9 UNIT/ML-% IJ SOLN
INTRAMUSCULAR | Status: AC | PRN
  Administered 2017-03-26: 1000 mL

## 2017-03-26 MED ORDER — ASPIRIN EC 81 MG PO TBEC
81.0000 mg | DELAYED_RELEASE_TABLET | Freq: Every day | ORAL | Status: DC
  Administered 2017-03-26 – 2017-03-27 (×2): 81 mg via ORAL
  Filled 2017-03-26 (×2): qty 1

## 2017-03-26 MED ORDER — ANGIOPLASTY BOOK
Freq: Once | Status: AC
  Administered 2017-03-26: 20:00:00
  Filled 2017-03-26: qty 1

## 2017-03-26 MED ORDER — HYDRALAZINE HCL 20 MG/ML IJ SOLN: 5.0000 mg | INTRAMUSCULAR | Status: AC | PRN

## 2017-03-26 MED ORDER — SODIUM CHLORIDE 0.9 % WEIGHT BASED INFUSION
3.0000 mL/kg/h | INTRAVENOUS | Status: DC
  Administered 2017-03-26: 3 mL/kg/h via INTRAVENOUS

## 2017-03-26 MED ORDER — LIDOCAINE HCL (PF) 1 % IJ SOLN
INTRAMUSCULAR | Status: DC | PRN
  Administered 2017-03-26: 2 mL via SUBCUTANEOUS

## 2017-03-26 MED ORDER — MORPHINE SULFATE (PF) 4 MG/ML IV SOLN
2.0000 mg | Freq: Once | INTRAVENOUS | Status: AC
Start: 2017-03-26 — End: 2017-03-26
  Administered 2017-03-26: 2 mg via INTRAVENOUS
  Filled 2017-03-26: qty 1

## 2017-03-26 MED ORDER — PANTOPRAZOLE SODIUM 40 MG PO TBEC
40.0000 mg | DELAYED_RELEASE_TABLET | Freq: Every day | ORAL | Status: DC
  Administered 2017-03-26 – 2017-03-27 (×2): 40 mg via ORAL
  Filled 2017-03-26 (×2): qty 1

## 2017-03-26 SURGICAL SUPPLY — 18 items
BALLN SAPPHIRE 2.0X15 (BALLOONS) ×2
BALLN SAPPHIRE ~~LOC~~ 2.75X15 (BALLOONS) ×1 IMPLANT
BALLOON SAPPHIRE 2.0X15 (BALLOONS) IMPLANT
CATH 5FR JL3.5 JR4 ANG PIG MP (CATHETERS) ×1 IMPLANT
CATH INFINITI 5 FR 3DRC (CATHETERS) ×1 IMPLANT
CATH VISTA GUIDE 6FR XBLAD3.5 (CATHETERS) ×1 IMPLANT
DEVICE RAD COMP TR BAND LRG (VASCULAR PRODUCTS) ×1 IMPLANT
GLIDESHEATH SLEND SS 6F .021 (SHEATH) ×1 IMPLANT
GUIDEWIRE INQWIRE 1.5J.035X260 (WIRE) IMPLANT
INQWIRE 1.5J .035X260CM (WIRE) ×2
KIT ENCORE 26 ADVANTAGE (KITS) ×1 IMPLANT
KIT HEART LEFT (KITS) ×2 IMPLANT
PACK CARDIAC CATHETERIZATION (CUSTOM PROCEDURE TRAY) ×2 IMPLANT
STENT RESOLUTE ONYX 2.5X22 (Permanent Stent) ×1 IMPLANT
SYR MEDRAD MARK V 150ML (SYRINGE) ×2 IMPLANT
TRANSDUCER W/STOPCOCK (MISCELLANEOUS) ×2 IMPLANT
TUBING CIL FLEX 10 FLL-RA (TUBING) ×2 IMPLANT
WIRE COUGAR XT STRL 190CM (WIRE) ×1 IMPLANT

## 2017-03-26 NOTE — Progress Notes (Signed)
#   7. S/W  JO @ [place] # [phone]    1. BRILINTA  90 MG BID   COVER- YES  CO-PAY- $ 47.00  PRIOR APPROVAL- NO   PREFERRED PHARMACY : [facility] AND [facility]

## 2017-03-26 NOTE — Progress Notes (Signed)
  Echocardiogram 2D Echocardiogram has been performed.  Herbert Carr 03/26/2017, 4:20 PM

## 2017-03-26 NOTE — Care Management Note (Addendum)
Case Management Note  Patient Details  Name: NATHANUEL CABREJA MRN: [id number] Date of Birth: September 17, 1940  Subjective/Objective:  From home, presents with NSTEMI, has hx of prostate ca.  S/p coronary stent intervention, will be on brilinta, NCM awaiting benefit check. NCM gave patient the 30 day free card for brilinta, he states he will be going to [employer], NCM informed him to go to a [facility] , not sure if [place] know how to run  [employer], but [employer] doe have brilinta in stock.                     Action/Plan: NCM will follow for dc needs.   Expected Discharge Date:                  Expected Discharge Plan:  Home/Self Care  In-House Referral:     Discharge planning Services  CM Consult  Post Acute Care Choice:    Choice offered to:     DME Arranged:    DME Agency:     HH Arranged:    [place] Agency:     Status of Service:  Completed, signed off  If discussed at [employer] of Stay Meetings, dates discussed:    Additional Comments:  Zenon Mayo, RN 03/26/2017, 2:57 PM

## 2017-03-26 NOTE — H&P (View-Only) (Signed)
Progress Note  Patient Name: Herbert Carr Date of Encounter: 03/26/2017  Primary Cardiologist: New  Subjective   No chest pain/burning at this time.   Inpatient Medications    Scheduled Meds: . aspirin EC  81 mg Oral Daily  . atorvastatin  80 mg Oral q1800  . iopamidol      . levothyroxine  150 mcg Oral QAC breakfast  . pantoprazole  40 mg Oral Daily  . predniSONE  10 mg Oral BID  . vitamin B-12  1,000 mcg Oral Daily   Continuous Infusions: . heparin 950 Units/hr (03/25/17 2246)   PRN Meds: aluminum hydroxide-magnesium carbonate   Vital Signs    Vitals:   03/25/17 2330 03/26/17 0000 03/26/17 0015 03/26/17 0417  BP: (!) 173/79 (!) 177/91 (!) 141/65 (!) 147/79  Pulse: 70 77 79 77  Resp: 13 15 19 16   Temp:    98 F (36.7 C)  TempSrc:    Oral  SpO2: 99% 98% 96% 98%  Weight:    174 lb 14.4 oz (79.3 kg)  Height:    6' (1.829 m)    Intake/Output Summary (Last 24 hours) at 03/26/17 0750 Last data filed at 03/26/17 0101  Gross per 24 hour  Intake                0 ml  Output             1250 ml  Net            -1250 ml   Filed Weights   03/25/17 1900 03/26/17 0417  Weight: 170 lb (77.1 kg) 174 lb 14.4 oz (79.3 kg)    Telemetry    SR - Personally Reviewed  ECG    SR - Personally Reviewed  Physical Exam   General: Well developed, well nourished, male appearing in no acute distress. Head: Normocephalic, atraumatic.  Neck: Supple without bruits, JVD. Lungs:  Resp regular and unlabored, CTA. Heart: RRR, S1, S2, no S3, S4, or murmur; no rub. Abdomen: Soft, non-tender, non-distended with normoactive bowel sounds. No hepatomegaly. No rebound/guarding. No obvious abdominal masses. Extremities: No clubbing, cyanosis, edema. Distal pedal pulses are 2+ bilaterally. Neuro: Alert and oriented X 3. Moves all extremities spontaneously. Psych: Normal affect.  Labs    Chemistry Recent Labs Lab 03/22/17 0054 03/25/17 1923  NA 142 134*  K 3.1* 3.8  CL 106  100*  CO2 28 29  GLUCOSE 123* 142*  BUN 12 9  CREATININE 0.68 0.73  CALCIUM 9.3 9.1  PROT 8.0  --   ALBUMIN 4.5  --   AST 27  --   ALT 28  --   ALKPHOS 63  --   BILITOT 0.8  --   GFRNONAA >60 >60  GFRAA >60 >60  ANIONGAP 8 5     Hematology Recent Labs Lab 03/22/17 0054 03/25/17 1923 03/26/17 0644  WBC 5.7 16.2* 14.5*  RBC 4.70 4.52 4.39  HGB 14.7 14.0 13.7  HCT 42.5 41.0 39.9  MCV 90.4 90.7 90.9  MCH 31.3 31.0 31.2  MCHC 34.6 34.1 34.3  RDW 12.4 12.6 12.7  PLT 153 184 184    Cardiac Enzymes Recent Labs Lab 03/22/17 0054 03/22/17 0341 03/26/17 0045  TROPONINI <0.03 <0.03 9.37*    Recent Labs Lab 03/25/17 1944  TROPIPOC 4.32*     BNP Recent Labs Lab 03/26/17 0045  BNP 265.8*     DDimer No results for input(s): DDIMER in the last 168 hours.  Radiology    Dg Chest 2 View  Result Date: 03/25/2017 CLINICAL DATA:  Chest pain EXAM: CHEST  2 VIEW COMPARISON:  03/22/2017 FINDINGS: The heart size and mediastinal contours are within normal limits. Both lungs are clear. Degenerative changes of the spine. IMPRESSION: No active cardiopulmonary disease. Electronically Signed   By: Donavan Foil M.D.   On: 03/25/2017 19:54   Ct Angio Chest Aorta W And/or Wo Contrast  Result Date: 03/25/2017 CLINICAL DATA:  Shortness of breath and chest pain. EXAM: CT ANGIOGRAPHY CHEST WITH CONTRAST TECHNIQUE: Multidetector CT imaging of the chest was performed using the standard protocol during bolus administration of intravenous contrast. Multiplanar CT image reconstructions and MIPs were obtained to evaluate the vascular anatomy. CONTRAST:  100 mL Isovue 370 COMPARISON:  None. FINDINGS: Cardiovascular: Contrast injection is sufficient to demonstrate satisfactory opacification of the pulmonary arteries to the segmental level. There is no pulmonary embolus. The main pulmonary artery is within normal limits for size. There is no CT evidence of acute right heart strain. There is mild  atherosclerotic calcification the thoracic aorta There is a normal 3-vessel arch branching pattern. Heart size is normal, without pericardial effusion. Mild coronary artery calcification. Mediastinum/Nodes: No mediastinal, hilar or axillary lymphadenopathy. The visualized thyroid and thoracic esophageal course are unremarkable. Lungs/Pleura: No pulmonary nodules or masses. No pleural effusion or pneumothorax. No focal airspace consolidation. No focal pleural abnormality. Upper Abdomen: Contrast bolus timing is not optimized for evaluation of the abdominal organs. Intermediate attenuation right adrenal nodule measured 1.3 cm. Musculoskeletal: No chest wall abnormality. No acute or significant osseous findings. Review of the MIP images confirms the above findings. IMPRESSION: 1. No pulmonary embolus or other acute thoracic abnormality. 2.  Aortic Atherosclerosis (ICD10-I70.0). 3. Intermediate attenuation right adrenal nodule. This is indeterminate. Further characterization with abdominal MRI or adrenal protocol CT is recommended on a nonemergent outpatient basis. Electronically Signed   By: Ulyses Jarred M.D.   On: 03/25/2017 21:42    Cardiac Studies   N/a  Patient Profile     76 y.o. male with no known CAD, HTN, hypothyroidism, and prostate Ca s/p radiotherapy seed implant who presented with chest burning for 2 weeks.   Assessment & Plan    1. NSTEMI: Pt reported chest burning/pressure for the past 2 weeks. Thought this was related to GERD and was seen at [facility] ED recently with negative work up. Reports symptoms persisted and had an intense episode yesterday morning. Currently pretty active, swims and jogs routinely and does not normally experience anginal symptoms. Trop 4>>9.37. Remains on IV heparin. Given symptoms and positive troponin will plan for cardiac cath today. LDL 69 and Hgb A1c 5.3. -- IV heparin, statin, ASA -- The patient understands that risks included but are not limited to stroke (1 in  1000), death (1 in 1000), kidney failure [usually temporary] (1 in 500), bleeding (1 in 200), allergic reaction [possibly serious] (1 in 200).   2. HTN: States that he has never been on medications for hypertension in the past. Bps elevated here. Add low dose metoprolol 12.5mg  BID   3. Hypothyroidism: On synthroid  4. Hx of prostate Ca    Signed, Reino Bellis, NP  03/26/2017, 7:50 AM  Pager # [phone]   For questions or updates, please contact [facility] Please consult [email or web address] for contact info under Cardiology/STEMI. Daytime calls, contact the Day Call APP (6a-8a) or assigned team (Teams A-D) provider (7:30a - 5p). All other daytime calls (7:30-5p), contact the Card Master @  [phone].   Nighttime calls, contact the assigned APP (5p-8p) or MD (6:30p-8p). Overnight calls (8p-6a), contact the on call Fellow @ [phone].  I have examined the patient and reviewed assessment and plan and discussed with patient.  Agree with above as stated.  Patient pain free.  Agreeable to cath.  Routine ECG done shows anterior ST changes.  I suspect he ahs severe LAD disease.  Will expedite his cath.  He remains pain free and says he could "run right out of the hospital right now."  Continue heparin.  Spoke with cath lab to make him next case.  Critical care time 40 minutes  Larae Grooms

## 2017-03-26 NOTE — Discharge Summary (Signed)
Discharge Summary    Patient ID: Herbert Carr,  MRN: [id number], DOB/AGE: 1941/03/14 76 y.o.  Admit date: 03/25/2017 Discharge date: 03/27/2017  Primary Care Provider: Glenda Chroman Primary Cardiologist: Irish Lack  Discharge Diagnoses    Active Problems:   NSTEMI (non-ST elevated myocardial infarction) [facility])   Palpitations   Essential hypertension   Hypothyroidism   GERD (gastroesophageal reflux disease)   Abnormal ECG   Allergies No Known Allergies  Diagnostic Studies/Procedures    Cath: 03/26/17  Conclusion     RPDA lesion, 40 %stenosed.  Mid RCA lesion, 30 %stenosed.  Prox Cx to Mid Cx lesion, 20 %stenosed.  A STENT RESOLUTE ONYX 2.5X22 drug eluting stent was successfully placed.  Mid LAD lesion, 99 %stenosed.  Post intervention, there is a 0% residual stenosis.  The left ventricular systolic function is normal.  LV end diastolic pressure is normal.  The left ventricular ejection fraction is 50-55% by visual estimate.  There is no mitral valve regurgitation.   1. Severe single vessel CAD with severe stenosis mid LAD 2. Successful PTCA/DES x 1 mid LAD 3. Mild disease RCA and Circumflex 4. Overall preserved LV systolic function with mild apical WMA and LVEF around 55%.   Recommendations: Will continue DAPT with ASA and Brilinta x 1 year. High intensity statin and GDMT with beta blocker and Ace inh/ARB as tolerated.     TTE: 03/26/17  Study Conclusions  - Left ventricle: The cavity size was normal. There was mild focal   basal hypertrophy of the septum. Systolic function was normal.   The estimated ejection fraction was in the range of 55% to 60%.   Apical septal severe hypokinesis. Apical inferior hypokinesis.   Features are consistent with a pseudonormal left ventricular   filling pattern, with concomitant abnormal relaxation and   increased filling pressure (grade 2 diastolic dysfunction). - Aortic valve: There was no stenosis. - Aorta:  Borderline dilated aortic root. Aortic root dimension: 37   mm (ED). - Mitral valve: There was mild regurgitation. - Left atrium: The atrium was mildly dilated. - Right ventricle: The cavity size was normal. Systolic function   was normal. - Pulmonary arteries: No complete TR doppler jet so unable to   estimate PA systolic pressure. - Systemic veins: IVC measured 2.2 cm with > 50% respirophasic   variation, suggesting RA pressure 8 mmHg.  Impressions:  - Normal LV size with mild focal basal septal hypertrophy. EF   55-60%. Severe apical septal hypokinesis and apical inferior   hypokinesis. Moderate diastolic dysfunction. Normal RV size and   systolic function. Mild mitral regurgitation. _____________   History of Present Illness     76 yo with no known hx of CAD, HTN, hypothyroidism, prostate cancer s/p radiotherapy seed implant who presented with chest burning. Pt reported chest burning/pressure for the past 2 weeks prior to admission. Thought this was related to GERD and was seen at [facility] ED recently with negative work up. Reported symptoms persisted and had an intense episode the morning prior to admission. Currently pretty active, swims and jogs routinely and does not normally experience anginal symptoms. Presented to the ED and noted to have an initial troponin of 4. He was placed on IV heparin and admitted for further work up.   Hospital Course     Troponin continued to rise to 9.37. He denied any further chest burning, and no chest pain. Routine EKG done prior cath showed new ST elevation in the septal leads and  troponin peaked at >65. He was taken urgently to the cath lab and PCI/DES to the pLAD. Plan for DAPT with ASA/Brilinta for at least one year. He was started on high dose statin, and low dose metoprolol was added with BP/HR tolerating. Post cath labs showed stable Cr 0.72 and Hgb 11.9. LDL 69, and Hgb 5.3. No complications noted post cath. Worked well with cardiac rehab with no  further chest pain.   General: Well developed, well nourished, male appearing in no acute distress. Head: Normocephalic, atraumatic.  Neck: Supple without bruits, JVD. Lungs:  Resp regular and unlabored, CTA. Heart: RRR, S1, S2, no S3, S4, or murmur; no rub. Abdomen: Soft, non-tender, non-distended with normoactive bowel sounds. No hepatomegaly. No rebound/guarding. No obvious abdominal masses. Extremities: No clubbing, cyanosis, edema. Distal pedal pulses are 2+ bilaterally. R radial cath site stable without bruising or hematoma Neuro: Alert and oriented X 3. Moves all extremities spontaneously. Psych: Normal affect.  Herbert Carr was seen by Dr. Irish Lack and determined stable for discharge home. Follow up in the office has been arranged. Medications are listed below.   _____________  Discharge Vitals Blood pressure 111/60, pulse 71, temperature 98.1 F (36.7 C), temperature source Oral, resp. rate 19, height 6' (1.829 m), weight 171 lb 15.3 oz (78 kg), SpO2 98 %.  Filed Weights   03/25/17 1900 03/26/17 0417 03/27/17 0603  Weight: 170 lb (77.1 kg) 174 lb 14.4 oz (79.3 kg) 171 lb 15.3 oz (78 kg)    Labs & Radiologic Studies    CBC  Recent Labs  03/26/17 0644 03/27/17 0320  WBC 14.5* 10.3  HGB 13.7 11.9*  HCT 39.9 36.1*  MCV 90.9 92.1  PLT 184 322*   Basic Metabolic Panel  Recent Labs  03/26/17 0644 03/27/17 0320  NA 138 139  K 3.3* 3.7  CL 99* 105  CO2 28 28  GLUCOSE 126* 158*  BUN 7 10  CREATININE 0.71 0.72  CALCIUM 8.8* 8.5*   Liver Function Tests  Recent Labs  03/26/17 0644  AST 119*  ALT 36  ALKPHOS 59  BILITOT 0.7  PROT 6.8  ALBUMIN 3.7   No results for input(s): LIPASE, AMYLASE in the last 72 hours. Cardiac Enzymes  Recent Labs  03/26/17 0045 03/26/17 0644  TROPONINI 9.37* >65.00*   BNP Invalid input(s): POCBNP D-Dimer No results for input(s): DDIMER in the last 72 hours. Hemoglobin A1C  Recent Labs  03/26/17 0045  HGBA1C 5.3     Fasting Lipid Panel  Recent Labs  03/26/17 0045  CHOL 117  HDL 36*  LDLCALC 69  TRIG 58  CHOLHDL 3.3   Thyroid Function Tests  Recent Labs  03/26/17 0045  TSH 1.438   _____________  Dg Chest 2 View  Result Date: 03/25/2017 CLINICAL DATA:  Chest pain EXAM: CHEST  2 VIEW COMPARISON:  03/22/2017 FINDINGS: The heart size and mediastinal contours are within normal limits. Both lungs are clear. Degenerative changes of the spine. IMPRESSION: No active cardiopulmonary disease. Electronically Signed   By: Donavan Foil M.D.   On: 03/25/2017 19:54   Dg Chest 2 View  Result Date: 03/22/2017 CLINICAL DATA:  76 y/o  M; mid sternal chest pain. EXAM: CHEST  2 VIEW COMPARISON:  10/16/2005 chest radiograph. FINDINGS: The heart size and mediastinal contours are within normal limits. Both lungs are clear. Mild multilevel degenerative changes of the thoracic spine. IMPRESSION: No active cardiopulmonary disease. Electronically Signed   By: Kristine Garbe M.D.   On:  03/22/2017 01:33   Ct Angio Chest Aorta W And/or Wo Contrast  Result Date: 03/25/2017 CLINICAL DATA:  Shortness of breath and chest pain. EXAM: CT ANGIOGRAPHY CHEST WITH CONTRAST TECHNIQUE: Multidetector CT imaging of the chest was performed using the standard protocol during bolus administration of intravenous contrast. Multiplanar CT image reconstructions and MIPs were obtained to evaluate the vascular anatomy. CONTRAST:  100 mL Isovue 370 COMPARISON:  None. FINDINGS: Cardiovascular: Contrast injection is sufficient to demonstrate satisfactory opacification of the pulmonary arteries to the segmental level. There is no pulmonary embolus. The main pulmonary artery is within normal limits for size. There is no CT evidence of acute right heart strain. There is mild atherosclerotic calcification the thoracic aorta There is a normal 3-vessel arch branching pattern. Heart size is normal, without pericardial effusion. Mild coronary artery  calcification. Mediastinum/Nodes: No mediastinal, hilar or axillary lymphadenopathy. The visualized thyroid and thoracic esophageal course are unremarkable. Lungs/Pleura: No pulmonary nodules or masses. No pleural effusion or pneumothorax. No focal airspace consolidation. No focal pleural abnormality. Upper Abdomen: Contrast bolus timing is not optimized for evaluation of the abdominal organs. Intermediate attenuation right adrenal nodule measured 1.3 cm. Musculoskeletal: No chest wall abnormality. No acute or significant osseous findings. Review of the MIP images confirms the above findings. IMPRESSION: 1. No pulmonary embolus or other acute thoracic abnormality. 2.  Aortic Atherosclerosis (ICD10-I70.0). 3. Intermediate attenuation right adrenal nodule. This is indeterminate. Further characterization with abdominal MRI or adrenal protocol CT is recommended on a nonemergent outpatient basis. Electronically Signed   By: Ulyses Jarred M.D.   On: 03/25/2017 21:42   Disposition   Pt is being discharged home today in good condition.  Follow-up Plans & Appointments    Follow-up Information    Imogene Burn, PA-C Follow up on 04/08/2017.   Specialty:  Cardiology Why:  at 12:15pm for you follow uup appt.  Contact information: [address] [place] [address] [phone]          Discharge Instructions    Amb Referral to Cardiac Rehabilitation    Complete by:  As directed    Diagnosis:   Coronary Stents PTCA NSTEMI     Call MD for:  redness, tenderness, or signs of infection (pain, swelling, redness, odor or green/yellow discharge around incision site)    Complete by:  As directed    Diet - low sodium heart healthy    Complete by:  As directed    Discharge instructions    Complete by:  As directed    Radial Site Care Refer to this sheet in the next few weeks. These instructions provide you with information on caring for yourself after your procedure. Your caregiver  may also give you more specific instructions. Your treatment has been planned according to current medical practices, but problems sometimes occur. Call your caregiver if you have any problems or questions after your procedure. HOME CARE INSTRUCTIONS You may shower the day after the procedure.Remove the bandage (dressing) and gently wash the site with plain soap and water.Gently pat the site dry.  Do not apply powder or lotion to the site.  Do not submerge the affected site in water for 3 to 5 days.  Inspect the site at least twice daily.  Do not flex or bend the affected arm for 24 hours.  No lifting over 5 pounds (2.3 kg) for 5 days after your procedure.  Do not drive home if you are discharged the same day of the  procedure. Have someone else drive you.  You may drive 24 hours after the procedure unless otherwise instructed by your caregiver.  What to expect: Any bruising will usually fade within 1 to 2 weeks.  Blood that collects in the tissue (hematoma) may be painful to the touch. It should usually decrease in size and tenderness within 1 to 2 weeks.  SEEK IMMEDIATE MEDICAL CARE IF: You have unusual pain at the radial site.  You have redness, warmth, swelling, or pain at the radial site.  You have drainage (other than a small amount of blood on the dressing).  You have chills.  You have a fever or persistent symptoms for more than 72 hours.  You have a fever and your symptoms suddenly get worse.  Your arm becomes pale, cool, tingly, or numb.  You have heavy bleeding from the site. Hold pressure on the site.   PLEASE DO NOT MISS ANY DOSES OF YOUR BRILINTA!!!!! Also keep a log of you blood pressures and bring back to your follow up appt. Please call the office with any questions.   Patients taking blood thinners should generally stay away from medicines like ibuprofen, Advil, Motrin, naproxen, and Aleve due to risk of stomach bleeding. You may take Tylenol as directed or talk to your  primary doctor about alternatives. No lifting over 10 lbs for 4 weeks. No sexual activity for 4 weeks. You may not return to work until cleared by your cardiologist. Keep procedure site clean & dry. If you notice increased pain, swelling, bleeding or pus, call/return!  You may shower, but no soaking baths/hot tubs/pools for 1 week.   Increase activity slowly    Complete by:  As directed       Discharge Medications     Medication List    TAKE these medications   aluminum hydroxide-magnesium carbonate 95-358 MG/15ML Susp Commonly known as:  GAVISCON Take 15 mLs by mouth as needed for indigestion or heartburn.   aspirin 81 MG EC tablet Take 1 tablet (81 mg total) by mouth daily.   atorvastatin 80 MG tablet Commonly known as:  LIPITOR Take 1 tablet (80 mg total) by mouth daily at 6 PM.   hydrOXYzine 25 MG tablet Commonly known as:  ATARAX/VISTARIL Take 25 mg by mouth as needed.   levothyroxine 150 MCG tablet Commonly known as:  SYNTHROID, LEVOTHROID Take 150 mcg by mouth daily.   metoprolol tartrate 25 MG tablet Commonly known as:  LOPRESSOR Take 0.5 tablets (12.5 mg total) by mouth 2 (two) times daily.   nitroGLYCERIN 0.4 MG SL tablet Commonly known as:  NITROSTAT Place 1 tablet (0.4 mg total) under the tongue every 5 (five) minutes as needed.   pantoprazole 40 MG tablet Commonly known as:  PROTONIX Take 40 mg by mouth daily.   predniSONE 10 MG tablet Commonly known as:  DELTASONE Take 10 mg by mouth 2 (two) times daily.   ranitidine 150 MG tablet Commonly known as:  ZANTAC Take 150 mg by mouth daily.   ticagrelor 90 MG Tabs tablet Commonly known as:  BRILINTA Take 1 tablet (90 mg total) by mouth 2 (two) times daily.   triamcinolone ointment 0.1 % Commonly known as:  KENALOG Apply 1 application topically 2 (two) times daily.   vitamin B-12 1000 MCG tablet Commonly known as:  CYANOCOBALAMIN Take 1,000 mcg by mouth daily. Gummie        Aspirin prescribed  at discharge?  Yes High Intensity Statin Prescribed? (Lipitor 40-80mg  or Crestor  20-40mg ): Yes Beta Blocker Prescribed? Yes For EF <40%, was ACEI/ARB Prescribed? No: EF ok ADP Receptor Inhibitor Prescribed? (i.e. Plavix etc.-Includes Medically Managed Patients): Yes For EF <40%, Aldosterone Inhibitor Prescribed? No: EF ok Was EF assessed during THIS hospitalization? Yes Was Cardiac Rehab II ordered? (Included Medically managed Patients): Yes   Outstanding Labs/Studies   FLP/LFTs in 6 weeks if tolerating statin.   Duration of Discharge Encounter   Greater than 30 minutes including physician time.  Signed, Reino Bellis NP-C 03/27/2017, 9:50 AM   I have examined the patient and reviewed assessment and plan and discussed with patient.  Agree with above as stated.   GEN: Well nourished, well developed, in no acute distress  HEENT: normal  Neck: no JVD, carotid bruits, or masses Cardiac: RRR; no murmurs, rubs, or gallops,no edema  Respiratory:  clear to auscultation bilaterally, normal work of breathing GI: soft, nontender, nondistended,  MS: no deformity or atrophy , no radial hematoma, 2+ right radial pulse Skin: warm and dry, no rash Neuro:  Strength and sensation are intact Psych: euthymic mood, full affect  Continue dual antiplatelet therapy. COntinue aggressive secondary prevention.   Larae Grooms

## 2017-03-26 NOTE — Interval H&P Note (Signed)
History and Physical Interval Note:  03/26/2017 10:12 AM  Herbert Carr  has presented today for cardiac cath with the diagnosis of NSTEMI.   The various methods of treatment have been discussed with the patient and family. After consideration of risks, benefits and other options for treatment, the patient has consented to  Procedure(s): LEFT HEART CATH AND CORONARY ANGIOGRAPHY (N/A) as a surgical intervention .  The patient's history has been reviewed, patient examined, no change in status, stable for surgery.  I have reviewed the patient's chart and labs.  Questions were answered to the patient's satisfaction.    Cath Lab Visit (complete for each Cath Lab visit)  Clinical Evaluation Leading to the Procedure:   ACS: Yes.    Non-ACS:    Anginal Classification: CCS IV  Anti-ischemic medical therapy: No Therapy  Non-Invasive Test Results: No non-invasive testing performed  Prior CABG: No previous CABG       Lauree Chandler

## 2017-03-26 NOTE — Progress Notes (Signed)
Patient offered nourishment. Healthy appetite. Family present and assisted pateint in eating.
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
## 2017-03-26 NOTE — Progress Notes (Addendum)
Progress Note  Patient Name: Herbert Carr Date of Encounter: 03/26/2017  Primary Cardiologist: New  Subjective   No chest pain/burning at this time.   Inpatient Medications    Scheduled Meds: . aspirin EC  81 mg Oral Daily  . atorvastatin  80 mg Oral q1800  . iopamidol      . levothyroxine  150 mcg Oral QAC breakfast  . pantoprazole  40 mg Oral Daily  . predniSONE  10 mg Oral BID  . vitamin B-12  1,000 mcg Oral Daily   Continuous Infusions: . heparin 950 Units/hr (03/25/17 2246)   PRN Meds: aluminum hydroxide-magnesium carbonate   Vital Signs    Vitals:   03/25/17 2330 03/26/17 0000 03/26/17 0015 03/26/17 0417  BP: (!) 173/79 (!) 177/91 (!) 141/65 (!) 147/79  Pulse: 70 77 79 77  Resp: 13 15 19 16   Temp:    98 F (36.7 C)  TempSrc:    Oral  SpO2: 99% 98% 96% 98%  Weight:    174 lb 14.4 oz (79.3 kg)  Height:    6' (1.829 m)    Intake/Output Summary (Last 24 hours) at 03/26/17 0750 Last data filed at 03/26/17 0101  Gross per 24 hour  Intake                0 ml  Output             1250 ml  Net            -1250 ml   Filed Weights   03/25/17 1900 03/26/17 0417  Weight: 170 lb (77.1 kg) 174 lb 14.4 oz (79.3 kg)    Telemetry    SR - Personally Reviewed  ECG    SR - Personally Reviewed  Physical Exam   General: Well developed, well nourished, male appearing in no acute distress. Head: Normocephalic, atraumatic.  Neck: Supple without bruits, JVD. Lungs:  Resp regular and unlabored, CTA. Heart: RRR, S1, S2, no S3, S4, or murmur; no rub. Abdomen: Soft, non-tender, non-distended with normoactive bowel sounds. No hepatomegaly. No rebound/guarding. No obvious abdominal masses. Extremities: No clubbing, cyanosis, edema. Distal pedal pulses are 2+ bilaterally. Neuro: Alert and oriented X 3. Moves all extremities spontaneously. Psych: Normal affect.  Labs    Chemistry Recent Labs Lab 03/22/17 0054 03/25/17 1923  NA 142 134*  K 3.1* 3.8  CL 106  100*  CO2 28 29  GLUCOSE 123* 142*  BUN 12 9  CREATININE 0.68 0.73  CALCIUM 9.3 9.1  PROT 8.0  --   ALBUMIN 4.5  --   AST 27  --   ALT 28  --   ALKPHOS 63  --   BILITOT 0.8  --   GFRNONAA >60 >60  GFRAA >60 >60  ANIONGAP 8 5     Hematology Recent Labs Lab 03/22/17 0054 03/25/17 1923 03/26/17 0644  WBC 5.7 16.2* 14.5*  RBC 4.70 4.52 4.39  HGB 14.7 14.0 13.7  HCT 42.5 41.0 39.9  MCV 90.4 90.7 90.9  MCH 31.3 31.0 31.2  MCHC 34.6 34.1 34.3  RDW 12.4 12.6 12.7  PLT 153 184 184    Cardiac Enzymes Recent Labs Lab 03/22/17 0054 03/22/17 0341 03/26/17 0045  TROPONINI <0.03 <0.03 9.37*    Recent Labs Lab 03/25/17 1944  TROPIPOC 4.32*     BNP Recent Labs Lab 03/26/17 0045  BNP 265.8*     DDimer No results for input(s): DDIMER in the last 168 hours.  Radiology    Dg Chest 2 View  Result Date: 03/25/2017 CLINICAL DATA:  Chest pain EXAM: CHEST  2 VIEW COMPARISON:  03/22/2017 FINDINGS: The heart size and mediastinal contours are within normal limits. Both lungs are clear. Degenerative changes of the spine. IMPRESSION: No active cardiopulmonary disease. Electronically Signed   By: Donavan Foil M.D.   On: 03/25/2017 19:54   Ct Angio Chest Aorta W And/or Wo Contrast  Result Date: 03/25/2017 CLINICAL DATA:  Shortness of breath and chest pain. EXAM: CT ANGIOGRAPHY CHEST WITH CONTRAST TECHNIQUE: Multidetector CT imaging of the chest was performed using the standard protocol during bolus administration of intravenous contrast. Multiplanar CT image reconstructions and MIPs were obtained to evaluate the vascular anatomy. CONTRAST:  100 mL Isovue 370 COMPARISON:  None. FINDINGS: Cardiovascular: Contrast injection is sufficient to demonstrate satisfactory opacification of the pulmonary arteries to the segmental level. There is no pulmonary embolus. The main pulmonary artery is within normal limits for size. There is no CT evidence of acute right heart strain. There is mild  atherosclerotic calcification the thoracic aorta There is a normal 3-vessel arch branching pattern. Heart size is normal, without pericardial effusion. Mild coronary artery calcification. Mediastinum/Nodes: No mediastinal, hilar or axillary lymphadenopathy. The visualized thyroid and thoracic esophageal course are unremarkable. Lungs/Pleura: No pulmonary nodules or masses. No pleural effusion or pneumothorax. No focal airspace consolidation. No focal pleural abnormality. Upper Abdomen: Contrast bolus timing is not optimized for evaluation of the abdominal organs. Intermediate attenuation right adrenal nodule measured 1.3 cm. Musculoskeletal: No chest wall abnormality. No acute or significant osseous findings. Review of the MIP images confirms the above findings. IMPRESSION: 1. No pulmonary embolus or other acute thoracic abnormality. 2.  Aortic Atherosclerosis (ICD10-I70.0). 3. Intermediate attenuation right adrenal nodule. This is indeterminate. Further characterization with abdominal MRI or adrenal protocol CT is recommended on a nonemergent outpatient basis. Electronically Signed   By: Ulyses Jarred M.D.   On: 03/25/2017 21:42    Cardiac Studies   N/a  Patient Profile     76 y.o. male with no known CAD, HTN, hypothyroidism, and prostate Ca s/p radiotherapy seed implant who presented with chest burning for 2 weeks.   Assessment & Plan    1. NSTEMI: Pt reported chest burning/pressure for the past 2 weeks. Thought this was related to GERD and was seen at [facility] ED recently with negative work up. Reports symptoms persisted and had an intense episode yesterday morning. Currently pretty active, swims and jogs routinely and does not normally experience anginal symptoms. Trop 4>>9.37. Remains on IV heparin. Given symptoms and positive troponin will plan for cardiac cath today. LDL 69 and Hgb A1c 5.3. -- IV heparin, statin, ASA -- The patient understands that risks included but are not limited to stroke (1 in  1000), death (1 in 1000), kidney failure [usually temporary] (1 in 500), bleeding (1 in 200), allergic reaction [possibly serious] (1 in 200).   2. HTN: States that he has never been on medications for hypertension in the past. Bps elevated here. Add low dose metoprolol 12.5mg  BID   3. Hypothyroidism: On synthroid  4. Hx of prostate Ca    Signed, Reino Bellis, NP  03/26/2017, 7:50 AM  Pager # [phone]   For questions or updates, please contact [place] Please consult [email or web address] for contact info under Cardiology/STEMI. Daytime calls, contact the Day Call APP (6a-8a) or assigned team (Teams A-D) provider (7:30a - 5p). All other daytime calls (7:30-5p), contact the Card Master @  [phone].   Nighttime calls, contact the assigned APP (5p-8p) or MD (6:30p-8p). Overnight calls (8p-6a), contact the on call Fellow @ [phone].  I have examined the patient and reviewed assessment and plan and discussed with patient.  Agree with above as stated.  Patient pain free.  Agreeable to cath.  Routine ECG done shows anterior ST changes.  I suspect he ahs severe LAD disease.  Will expedite his cath.  He remains pain free and says he could "run right out of the hospital right now."  Continue heparin.  Spoke with cath lab to make him next case.  Critical care time 40 minutes  Larae Grooms

## 2017-03-26 NOTE — Progress Notes (Signed)
ANTICOAGULATION CONSULT NOTE  Pharmacy Consult for heparin Indication: chest pain/ACS  Heparin Dosing Weight: 77.1 kg   Assessment: 35 yom presenting with CP. Pharmacy consulted to dose heparin for ACS. Not on anticoagulation PTA. CBC wnl. No bleed documented.  Initial heparin level = 0.11  Goal of Therapy:  Heparin level 0.3-0.7 units/ml Monitor platelets by anticoagulation protocol: Yes   Plan:  Heparin 2500 unit bolus Heparin to 1200 units / hr 8h heparin level  Possible cath?  Thank you Anette Guarneri, PharmD [phone]  03/26/2017 8:19 AM

## 2017-03-27 ENCOUNTER — Telehealth: Payer: Self-pay | Admitting: Interventional Cardiology

## 2017-03-27 LAB — CBC
HEMATOCRIT: 36.1 % — AB (ref 39.0–52.0)
HEMOGLOBIN: 11.9 g/dL — AB (ref 13.0–17.0)
MCH: 30.4 pg (ref 26.0–34.0)
MCHC: 33 g/dL (ref 30.0–36.0)
MCV: 92.1 fL (ref 78.0–100.0)
Platelets: 147 10*3/uL — ABNORMAL LOW (ref 150–400)
RBC: 3.92 MIL/uL — ABNORMAL LOW (ref 4.22–5.81)
RDW: 12.8 % (ref 11.5–15.5)
WBC: 10.3 10*3/uL (ref 4.0–10.5)

## 2017-03-27 LAB — BASIC METABOLIC PANEL
Anion gap: 6 (ref 5–15)
BUN: 10 mg/dL (ref 6–20)
CHLORIDE: 105 mmol/L (ref 101–111)
CO2: 28 mmol/L (ref 22–32)
CREATININE: 0.72 mg/dL (ref 0.61–1.24)
Calcium: 8.5 mg/dL — ABNORMAL LOW (ref 8.9–10.3)
GFR calc Af Amer: >60 mL/min (ref >60)
GFR calc non Af Amer: >60 mL/min (ref >60)
Glucose, Bld: 158 mg/dL — ABNORMAL HIGH (ref 65–99)
Potassium: 3.7 mmol/L (ref 3.5–5.1)
SODIUM: 139 mmol/L (ref 135–145)

## 2017-03-27 LAB — GLUCOSE, CAPILLARY: Glucose-Capillary: 131 mg/dL — ABNORMAL HIGH (ref 65–99)

## 2017-03-27 LAB — TROPONIN I: Troponin I: 5.76 ng/mL (ref <0.03)

## 2017-03-27 MED ORDER — ASPIRIN 81 MG PO TBEC
81.0000 mg | DELAYED_RELEASE_TABLET | Freq: Every day | ORAL | Status: AC
Start: 2017-03-28 — End: ?

## 2017-03-27 MED ORDER — TICAGRELOR 90 MG PO TABS: 90.0000 mg | ORAL_TABLET | Freq: Two times a day (BID) | ORAL | 2 refills | Status: DC

## 2017-03-27 MED ORDER — METOPROLOL TARTRATE 25 MG PO TABS: 12.5000 mg | ORAL_TABLET | Freq: Two times a day (BID) | ORAL | 1 refills | Status: DC

## 2017-03-27 MED ORDER — ATORVASTATIN CALCIUM 80 MG PO TABS: 80.0000 mg | ORAL_TABLET | Freq: Every day | ORAL | 1 refills | Status: DC

## 2017-03-27 MED ORDER — NITROGLYCERIN 0.4 MG SL SUBL: 0.4000 mg | SUBLINGUAL_TABLET | SUBLINGUAL | 2 refills | Status: DC | PRN

## 2017-03-27 NOTE — Telephone Encounter (Signed)
New Message    [facility] Estella Husk 10/16 1215p

## 2017-03-27 NOTE — Progress Notes (Signed)
CARDIAC REHAB PHASE I   PRE:  Rate/Rhythm: 75 SR iwht PAC    BP: sitting 120/59    SaO2:   MODE:  Ambulation: 480 ft   POST:  Rate/Rhythm: 82 SR    BP: sitting 136/63     SaO2:   Pt tolerated well, no c/o. Fairly steady. He likes exercise. Ed completed. Good reception. Understands Brilinta and NTG. Will increase activity slowly. Interested in [facility] and will send referral to [place].  [place] CES, ACSM 03/27/2017 9:00 AM

## 2017-03-28 MED FILL — Lidocaine HCl Local Inj 2%: INTRAMUSCULAR | Qty: 10 | Status: AC

## 2017-03-28 MED FILL — Nitroglycerin IV Soln 100 MCG/ML in D5W: INTRA_ARTERIAL | Qty: 10 | Status: AC

## 2017-03-28 NOTE — Telephone Encounter (Signed)
**Note De-Identified Herbert Carr Obfuscation** Patient contacted regarding discharge from [facility] on 03/27/17.  Patient understands to follow up with provider Ermalinda Barrios, PA-c on 04/08/17 at 12:15 at [facility] in [place]. Patient understands discharge instructions? Yes Patient understands medications and regiment? Yes Patient understands to bring all medications to this visit? Yes  The pt states that he is doing "great" and has no complaints at all at this time. He expressed great gratitude towards [facility] for the great care he received while there. He does have [facility]'s phone number to call if he has any questions or concerns.

## 2017-04-08 ENCOUNTER — Encounter: Payer: Self-pay | Admitting: Physician Assistant

## 2017-04-08 ENCOUNTER — Telehealth: Payer: Self-pay

## 2017-04-08 ENCOUNTER — Ambulatory Visit (INDEPENDENT_AMBULATORY_CARE_PROVIDER_SITE_OTHER): Payer: Medicare HMO | Admitting: Physician Assistant

## 2017-04-08 VITALS — BP 122/62 | HR 63 | Resp 16 | Ht 72.0 in | Wt 170.8 lb

## 2017-04-08 DIAGNOSIS — I214 Non-ST elevation (NSTEMI) myocardial infarction: Secondary | ICD-10-CM

## 2017-04-08 DIAGNOSIS — I10 Essential (primary) hypertension: Secondary | ICD-10-CM

## 2017-04-08 NOTE — Patient Instructions (Signed)
Your physician recommends that you continue on your current medications as directed. Please refer to the Current Medication list given to you today. Your physician recommends that you return for lab work in: 6 weeks (liver and lipid panels)--fasting Your physician recommends that you schedule a follow-up appointment in: 2 months with Dr. Domenic Polite.
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
## 2017-04-08 NOTE — Telephone Encounter (Signed)
**Note De-Identified Hillman Attig Obfuscation** The pt had a f/u today with Ermalinda Barrios, PA-c. He is taking Brilinta 90 mg BID and cannot afford it. While at his f/u today he was given Brilinta samples and advised that we would try to get a tier exception to lower the cost of his Brilinta. I attempted to do a tier exception through covermymeds but am unable to as he will be Dr McDowell's pt and Dr Domenic Polite does not come to this office so cannot complete a tier exception at the [facility] office. Will forward message to the [place] office.

## 2017-04-21 ENCOUNTER — Telehealth: Payer: Self-pay

## 2017-04-21 MED ORDER — CLOPIDOGREL BISULFATE 75 MG PO TABS: 75.0000 mg | ORAL_TABLET | Freq: Every day | ORAL | 0 refills | Status: DC

## 2017-04-21 NOTE — Telephone Encounter (Signed)
I have not seen Mr. Herbert Carr since 2016.  I reviewed the chart, he had a recent visit with Ms. Bonnell Public PA-C at which point none of these symptoms were mentioned.  Shortness of breath would be more typical.  I did see some mention of fatigue and back pain on review of other possible side effects.  We can switch to Plavix 75 mg daily to see if his symptoms resolve.

## 2017-04-21 NOTE — Telephone Encounter (Signed)
Patient notified. New prescription sent to [facility]

## 2017-04-21 NOTE — Telephone Encounter (Signed)
Patient stated since coming home from the hospital and starting Brilinta, he has been very week and having a lot of pain in his back and legs. Patient stated he did not notice any of this until starting Brilinta. Patient stated when he rest the pain seems to ease off some. Also, patient stated it is very difficult to complete ADL's due to being so weak. Patient very concerned this is all coming from [facility] and wants to know if he can be switched to a new medication. Patient denied any shortness of breath, lightheadedness or dizziness.

## 2017-05-20 ENCOUNTER — Other Ambulatory Visit: Payer: Self-pay | Admitting: Cardiology

## 2017-05-20 ENCOUNTER — Other Ambulatory Visit: Payer: Self-pay

## 2017-05-20 ENCOUNTER — Other Ambulatory Visit: Payer: Self-pay | Admitting: *Deleted

## 2017-05-20 DIAGNOSIS — I10 Essential (primary) hypertension: Secondary | ICD-10-CM

## 2017-05-21 LAB — HEPATIC FUNCTION PANEL
ALK PHOS: 64 IU/L (ref 39–117)
ALT: 32 IU/L (ref 0–44)
AST: 22 IU/L (ref 0–40)
Albumin: 4.5 g/dL (ref 3.5–4.8)
BILIRUBIN TOTAL: 1 mg/dL (ref 0.0–1.2)
BILIRUBIN, DIRECT: 0.28 mg/dL (ref 0.00–0.40)
TOTAL PROTEIN: 6.9 g/dL (ref 6.0–8.5)

## 2017-05-21 LAB — LIPID PANEL
Chol/HDL Ratio: 2.4 ratio (ref 0.0–5.0)
Cholesterol, Total: 78 mg/dL — ABNORMAL LOW (ref 100–199)
HDL: 33 mg/dL — AB (ref >39)
LDL Calculated: 31 mg/dL (ref 0–99)
Triglycerides: 71 mg/dL (ref 0–149)
VLDL Cholesterol Cal: 14 mg/dL (ref 5–40)

## 2017-05-28 ENCOUNTER — Telehealth: Payer: Self-pay | Admitting: *Deleted

## 2017-05-28 NOTE — Telephone Encounter (Signed)
I have not seen him since 2016.  History reviewed, he underwent placement of DES to the LAD and was seen by Ms. Vita Barley in October.  If he is having regular palpitations on a daily basis (but no angina or breathlessness), we might consider putting on a 48-hour Holter monitor prior to his pending visit.

## 2017-05-28 NOTE — Telephone Encounter (Signed)
Pt voiced understanding and says he will update on symptoms tomorrow and decide on holter monitor at that time - denies any chest pain/dizziness/SOB at this time.

## 2017-05-28 NOTE — Telephone Encounter (Signed)
Pt c/o palpitations and "fluttering feeling" in chest since last night. HR between 70-96 and BP running 150s/70s - denies chest pain/dizziness/SOB/swelling. Has appt with Dr Domenic Polite 12/17. Advised would forward to provider but may just need to monitor until upcoming appt. Also checked extender schedule and first available is 12/19.

## 2017-05-29 ENCOUNTER — Telehealth: Payer: Self-pay

## 2017-05-29 NOTE — Telephone Encounter (Signed)
Patient called today stating he feels much better today.

## 2017-06-08 ENCOUNTER — Encounter: Payer: Self-pay | Admitting: Cardiology
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
## 2017-06-09 ENCOUNTER — Encounter: Payer: Self-pay | Admitting: Cardiology

## 2017-06-09 ENCOUNTER — Ambulatory Visit: Payer: Medicare HMO | Admitting: Cardiology

## 2017-06-09 VITALS — BP 100/64 | HR 75 | Ht 72.0 in | Wt 165.0 lb

## 2017-06-09 DIAGNOSIS — I10 Essential (primary) hypertension: Secondary | ICD-10-CM

## 2017-06-09 DIAGNOSIS — I25119 Atherosclerotic heart disease of native coronary artery with unspecified angina pectoris: Secondary | ICD-10-CM

## 2017-06-09 DIAGNOSIS — R0989 Other specified symptoms and signs involving the circulatory and respiratory systems: Secondary | ICD-10-CM

## 2017-06-09 DIAGNOSIS — E782 Mixed hyperlipidemia: Secondary | ICD-10-CM | POA: Diagnosis not present

## 2017-06-09 MED ORDER — METOPROLOL TARTRATE 25 MG PO TABS: 25.0000 mg | ORAL_TABLET | Freq: Two times a day (BID) | ORAL | 3 refills | Status: DC

## 2017-06-09 MED ORDER — ATORVASTATIN CALCIUM 20 MG PO TABS: 20.0000 mg | ORAL_TABLET | Freq: Every day | ORAL | 1 refills | Status: DC

## 2017-06-09 NOTE — Patient Instructions (Addendum)
Medication Instructions:  Your physician has recommended you make the following change in your medication:   DECREASE Atorvastatin to 20 mg daily  INCREASE Lopressor to 25 mg twice daily  Please continue all other medications as prescribed  Labwork:  LIPIDS in 6 months  Orders give today  Testing/Procedures: Your physician has requested that you have a carotid duplex. This test is an ultrasound of the carotid arteries in your neck. It looks at blood flow through these arteries that supply the brain with blood. Allow one hour for this exam. There are no restrictions or special instructions.  Follow-Up: Your physician wants you to follow-up in: [place]. You will receive a reminder letter in the mail two months in advance. If you don't receive a letter, please call our office to schedule the follow-up appointment.  Any Other Special Instructions Will Be Listed Below (If Applicable).  If you need a refill on your cardiac medications before your next appointment, please call your pharmacy.

## 2017-06-18 ENCOUNTER — Ambulatory Visit (INDEPENDENT_AMBULATORY_CARE_PROVIDER_SITE_OTHER): Payer: Medicare HMO

## 2017-06-18 DIAGNOSIS — R0989 Other specified symptoms and signs involving the circulatory and respiratory systems: Secondary | ICD-10-CM

## 2017-06-20 LAB — VAS US CAROTID
LCCAPDIAS: 15 cm/s
LCCAPSYS: 137 cm/s
LEFT ECA DIAS: 0 cm/s
LEFT VERTEBRAL DIAS: 9 cm/s
LICAPDIAS: -13 cm/s
Left CCA dist dias: 15 cm/s
Left CCA dist sys: 75 cm/s
Left ICA dist dias: 31 cm/s
Left ICA dist sys: 133 cm/s
Left ICA prox sys: -71 cm/s
RCCADSYS: -122 cm/s
RCCAPDIAS: 0 cm/s
RIGHT ECA DIAS: 0 cm/s
RIGHT VERTEBRAL DIAS: -13 cm/s
Right CCA prox sys: 110 cm/s

## 2017-06-23 ENCOUNTER — Telehealth: Payer: Self-pay

## 2017-06-23 NOTE — Telephone Encounter (Signed)
-----   Message from Acquanetta Chain, LPN sent at [id number]/36/4680  7:27 AM EST -----   ----- Message ----- From: Satira Sark, MD Sent: 06/20/2017   3:21 PM To: Merlene Laughter, LPN  Results reviewed. Only 1-39% bilateral ICA stenoses. A copy of this test should be forwarded to Glenda Chroman, MD.

## 2017-06-23 NOTE — Telephone Encounter (Signed)
Patient notified. Routed to PCP 

## 2017-06-25 ENCOUNTER — Other Ambulatory Visit: Payer: Self-pay

## 2017-06-25 ENCOUNTER — Encounter (HOSPITAL_COMMUNITY): Payer: Self-pay | Admitting: Emergency Medicine

## 2017-06-25 ENCOUNTER — Emergency Department (HOSPITAL_COMMUNITY)
Admission: EM | Admit: 2017-06-25 | Discharge: 2017-06-25 | Disposition: A | Discharge status: Home / Self Care | Payer: Medicare HMO | Attending: Emergency Medicine | Admitting: Emergency Medicine

## 2017-06-25 DIAGNOSIS — E039 Hypothyroidism, unspecified: Secondary | ICD-10-CM | POA: Diagnosis not present

## 2017-06-25 DIAGNOSIS — I2510 Atherosclerotic heart disease of native coronary artery without angina pectoris: Secondary | ICD-10-CM | POA: Diagnosis not present

## 2017-06-25 DIAGNOSIS — I10 Essential (primary) hypertension: Secondary | ICD-10-CM | POA: Diagnosis not present

## 2017-06-25 DIAGNOSIS — E119 Type 2 diabetes mellitus without complications: Secondary | ICD-10-CM | POA: Diagnosis not present

## 2017-06-25 DIAGNOSIS — I252 Old myocardial infarction: Secondary | ICD-10-CM | POA: Diagnosis not present

## 2017-06-25 DIAGNOSIS — Z7982 Long term (current) use of aspirin: Secondary | ICD-10-CM | POA: Diagnosis not present

## 2017-06-25 DIAGNOSIS — Z7902 Long term (current) use of antithrombotics/antiplatelets: Secondary | ICD-10-CM | POA: Insufficient documentation

## 2017-06-25 DIAGNOSIS — F419 Anxiety disorder, unspecified: Secondary | ICD-10-CM

## 2017-06-25 DIAGNOSIS — Z8546 Personal history of malignant neoplasm of prostate: Secondary | ICD-10-CM | POA: Diagnosis not present

## 2017-06-25 DIAGNOSIS — Z79899 Other long term (current) drug therapy: Secondary | ICD-10-CM | POA: Diagnosis not present

## 2017-06-25 MED ORDER — ALPRAZOLAM 0.25 MG PO TABS: 0.2500 mg | ORAL_TABLET | Freq: Every day | ORAL | 0 refills | Status: DC | PRN

## 2017-06-25 NOTE — ED Triage Notes (Signed)
Pt reports he is on clopidogrel for his heart, states that 3 hours after med each morning, he feels like he is trembling all over and nervous, states he has taken his wifes nerve pill which seems to help his panic attacks. Denies pain, resp e/u,

## 2017-06-25 NOTE — ED Notes (Signed)
Pt verbalized understanding of discharge paperwork and prescriptions.  °

## 2017-06-25 NOTE — Discharge Instructions (Signed)
You need to continue to take your plavix every day.   Take xanax as needed for anxiety.   See your heart doctor and regular doctor  Return to ER if you have worse anxiety, chest pain, shortness of breath, chest pressure, thoughts or harming yourself or others
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
## 2017-06-30 ENCOUNTER — Telehealth: Payer: Self-pay | Admitting: Cardiology

## 2017-06-30 NOTE — Telephone Encounter (Signed)
Patient walked in.  Patient stated that since he has started taking his blood thinner medication that he has had diarrhea.  Wanted to know if he needs to change medication

## 2017-06-30 NOTE — Telephone Encounter (Signed)
Patient states since October he is having sever diarrhea a couple times a week every week. Patient thinks this is coming from plavix. Patient also cannot tolerate brilinta. Patient wants to know if there is anything else he can switch to?

## 2017-06-30 NOTE — Telephone Encounter (Signed)
Severe diarrhea would be an unusual reaction to Plavix. Unfortunately, we do not have many other options if he is intolerant to Brilinta. Effient would not be optimal since he is over 77 years old with increased intracranial bleeding risk. Still too early to stop dual antiplatelet therapy since DES intervention was in October 2018. Might consider seeing his PCP to look for other possible causes of his diarrhea.

## 2017-06-30 NOTE — Telephone Encounter (Signed)
Patient notified and verbalized understanding. 

## 2017-07-17 ENCOUNTER — Other Ambulatory Visit: Payer: Self-pay | Admitting: Cardiology

## 2017-08-25 ENCOUNTER — Telehealth: Payer: Self-pay | Admitting: Cardiology

## 2017-08-25 NOTE — Telephone Encounter (Signed)
Agree, he should see his PCP about the symptoms to exclude other possible etiologies and determine whether he needs any imaging studies of his head.

## 2017-08-25 NOTE — Telephone Encounter (Signed)
Patient states he has been having headaches for the past two months. Patient states this happens about a couple hours after taking plavix. Advised patient this can be a side effect of plavix but typically would have this symptom before now (started 5 months ago). Patient states it is pressure/sharp pain across his forehead. Patient states when he goes to bed at night the headache does go away. Advised patient to contact PCP as well because this could be a sinus issue. Will sent to provider for further recommendations.

## 2017-08-25 NOTE — Telephone Encounter (Signed)
Wife called asking if Plavix could be causing headaches for her husband

## 2017-08-25 NOTE — Telephone Encounter (Signed)
Patient notified and verbalized understanding. 

## 2017-09-15 ENCOUNTER — Encounter: Payer: Self-pay | Admitting: *Deleted

## 2017-09-15 ENCOUNTER — Telehealth: Payer: Self-pay | Admitting: Cardiology

## 2017-09-15 NOTE — Telephone Encounter (Signed)
Was told that the CT scan of his head was normal (will request results).  States that 2 hours after he takes the Plavix he notices a headache.  Was intolerant to the [place] & per last telephone note - Effient not an option.  Stated that he had back pain with the Brilinta - states he could deal with that better than dealing with the headache.  Message fwd to provider.

## 2017-09-15 NOTE — Telephone Encounter (Signed)
Herbert Carr walked in office stating that he had CT scan of head at [facility] 09/15/17.  Spoke with Dr. Woody Seller in regards to the dizziness he continues to have. Dr. Woody Seller suggested to patient to contact our office in regards to his blood thinner being changed . Please call [phone].

## 2017-09-16 NOTE — Telephone Encounter (Signed)
Headache is not a typical side effect of Plavix.  Please make sure that his head CT was normal.  If that is the case and he would like to switch back to Brilinta that is fine.  He is not yet 1 year out from placement of DES to the LAD and ideally we need to keep him on dual antiplatelet therapy to reduce his risk of stent thrombosis.

## 2017-09-19 MED ORDER — CLOPIDOGREL BISULFATE 75 MG PO TABS: 75.0000 mg | ORAL_TABLET | Freq: Every day | ORAL | Status: DC

## 2017-09-19 MED ORDER — TICAGRELOR 90 MG PO TABS: 90.0000 mg | ORAL_TABLET | Freq: Two times a day (BID) | ORAL | Status: DC

## 2017-09-19 NOTE — Telephone Encounter (Signed)
Patient notified.  Stated that he will go back to Brilinta 90mg  twice a day for now to see if the headaches are better.  He will call back with update in a few weeks.

## 2017-10-14 ENCOUNTER — Ambulatory Visit (INDEPENDENT_AMBULATORY_CARE_PROVIDER_SITE_OTHER): Payer: Medicare HMO | Admitting: Cardiology

## 2017-10-14 ENCOUNTER — Encounter: Payer: Self-pay | Admitting: Cardiology

## 2017-10-14 VITALS — BP 134/62 | HR 72 | Ht 72.0 in | Wt 168.0 lb

## 2017-10-14 DIAGNOSIS — I6523 Occlusion and stenosis of bilateral carotid arteries: Secondary | ICD-10-CM

## 2017-10-14 DIAGNOSIS — I10 Essential (primary) hypertension: Secondary | ICD-10-CM | POA: Diagnosis not present

## 2017-10-14 DIAGNOSIS — I25119 Atherosclerotic heart disease of native coronary artery with unspecified angina pectoris: Secondary | ICD-10-CM | POA: Diagnosis not present

## 2017-10-14 DIAGNOSIS — R51 Headache: Secondary | ICD-10-CM

## 2017-10-14 DIAGNOSIS — R519 Headache, unspecified: Secondary | ICD-10-CM

## 2017-10-14 NOTE — Patient Instructions (Signed)
Medication Instructions:  STOP PLAVIX STAY ON ASPIRIN   Labwork: NONE  Testing/Procedures: NONE  Follow-Up: Your physician wants you to follow-up in: 6 MONTHS .  You will receive a reminder letter in the mail two months in advance. If you don't receive a letter, please call our office to schedule the follow-up appointment.   Any Other Special Instructions Will Be Listed Below (If Applicable).  PLEASE LET us KNOW IF THE HEADACHES STOP (WITHIN 2 WEEKS) OF STOPPING PLAVIX. IF THEY DO NOT STOP, GO BACK ON THE PLAVIX & HAVE DR. VYAS REFER YOU TO A NEUROLOGIST FOR RECURRENT HEADACHES.    If you need a refill on your cardiac medications before your next appointment, please call your pharmacy.
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
## 2018-03-10 DIAGNOSIS — I10 Essential (primary) hypertension: Secondary | ICD-10-CM | POA: Diagnosis not present

## 2018-03-10 DIAGNOSIS — Z713 Dietary counseling and surveillance: Secondary | ICD-10-CM | POA: Diagnosis not present

## 2018-03-10 DIAGNOSIS — Z6822 Body mass index (BMI) 22.0-22.9, adult: Secondary | ICD-10-CM | POA: Diagnosis not present

## 2018-03-10 DIAGNOSIS — Z299 Encounter for prophylactic measures, unspecified: Secondary | ICD-10-CM | POA: Diagnosis not present

## 2018-03-10 DIAGNOSIS — E039 Hypothyroidism, unspecified: Secondary | ICD-10-CM | POA: Diagnosis not present

## 2018-04-15 DIAGNOSIS — C44622 Squamous cell carcinoma of skin of right upper limb, including shoulder: Secondary | ICD-10-CM | POA: Diagnosis not present

## 2018-04-15 DIAGNOSIS — E039 Hypothyroidism, unspecified: Secondary | ICD-10-CM | POA: Diagnosis not present

## 2018-04-15 DIAGNOSIS — Z299 Encounter for prophylactic measures, unspecified: Secondary | ICD-10-CM | POA: Diagnosis not present

## 2018-04-15 DIAGNOSIS — I10 Essential (primary) hypertension: Secondary | ICD-10-CM | POA: Diagnosis not present

## 2018-04-15 DIAGNOSIS — Z6823 Body mass index (BMI) 23.0-23.9, adult: Secondary | ICD-10-CM | POA: Diagnosis not present

## 2018-04-15 DIAGNOSIS — I2510 Atherosclerotic heart disease of native coronary artery without angina pectoris: Secondary | ICD-10-CM | POA: Diagnosis not present

## 2018-05-04 DIAGNOSIS — Z6823 Body mass index (BMI) 23.0-23.9, adult: Secondary | ICD-10-CM | POA: Diagnosis not present

## 2018-05-04 DIAGNOSIS — I10 Essential (primary) hypertension: Secondary | ICD-10-CM | POA: Diagnosis not present

## 2018-05-04 DIAGNOSIS — Z299 Encounter for prophylactic measures, unspecified: Secondary | ICD-10-CM | POA: Diagnosis not present

## 2018-05-04 DIAGNOSIS — C4492 Squamous cell carcinoma of skin, unspecified: Secondary | ICD-10-CM | POA: Diagnosis not present

## 2018-06-09 DIAGNOSIS — Z6823 Body mass index (BMI) 23.0-23.9, adult: Secondary | ICD-10-CM | POA: Diagnosis not present

## 2018-06-09 DIAGNOSIS — D0461 Carcinoma in situ of skin of right upper limb, including shoulder: Secondary | ICD-10-CM | POA: Diagnosis not present

## 2018-06-09 DIAGNOSIS — D485 Neoplasm of uncertain behavior of skin: Secondary | ICD-10-CM | POA: Diagnosis not present

## 2018-06-09 DIAGNOSIS — I10 Essential (primary) hypertension: Secondary | ICD-10-CM | POA: Diagnosis not present

## 2018-06-09 DIAGNOSIS — Z299 Encounter for prophylactic measures, unspecified: Secondary | ICD-10-CM | POA: Diagnosis not present

## 2018-06-23 DIAGNOSIS — Z299 Encounter for prophylactic measures, unspecified: Secondary | ICD-10-CM | POA: Diagnosis not present

## 2018-06-23 DIAGNOSIS — C4492 Squamous cell carcinoma of skin, unspecified: Secondary | ICD-10-CM | POA: Diagnosis not present

## 2018-06-23 DIAGNOSIS — Z6824 Body mass index (BMI) 24.0-24.9, adult: Secondary | ICD-10-CM | POA: Diagnosis not present

## 2018-08-12 DIAGNOSIS — E039 Hypothyroidism, unspecified: Secondary | ICD-10-CM | POA: Diagnosis not present

## 2018-08-12 DIAGNOSIS — I10 Essential (primary) hypertension: Secondary | ICD-10-CM | POA: Diagnosis not present

## 2018-08-12 DIAGNOSIS — Z1211 Encounter for screening for malignant neoplasm of colon: Secondary | ICD-10-CM | POA: Diagnosis not present

## 2018-08-12 DIAGNOSIS — Z125 Encounter for screening for malignant neoplasm of prostate: Secondary | ICD-10-CM | POA: Diagnosis not present

## 2018-08-12 DIAGNOSIS — Z1339 Encounter for screening examination for other mental health and behavioral disorders: Secondary | ICD-10-CM | POA: Diagnosis not present

## 2018-08-12 DIAGNOSIS — Z79899 Other long term (current) drug therapy: Secondary | ICD-10-CM | POA: Diagnosis not present

## 2018-08-12 DIAGNOSIS — Z1331 Encounter for screening for depression: Secondary | ICD-10-CM | POA: Diagnosis not present

## 2018-08-12 DIAGNOSIS — Z7189 Other specified counseling: Secondary | ICD-10-CM | POA: Diagnosis not present

## 2018-08-12 DIAGNOSIS — R5383 Other fatigue: Secondary | ICD-10-CM | POA: Diagnosis not present

## 2018-08-12 DIAGNOSIS — E7800 Pure hypercholesterolemia, unspecified: Secondary | ICD-10-CM | POA: Diagnosis not present

## 2018-08-12 DIAGNOSIS — Z6824 Body mass index (BMI) 24.0-24.9, adult: Secondary | ICD-10-CM | POA: Diagnosis not present

## 2018-08-12 DIAGNOSIS — Z299 Encounter for prophylactic measures, unspecified: Secondary | ICD-10-CM | POA: Diagnosis not present

## 2018-08-12 DIAGNOSIS — Z0000 Encounter for general adult medical examination without abnormal findings: Secondary | ICD-10-CM | POA: Diagnosis not present

## 2018-08-14 ENCOUNTER — Ambulatory Visit: Payer: Medicare HMO | Admitting: Cardiology

## 2018-08-14 ENCOUNTER — Encounter: Payer: Self-pay | Admitting: Cardiology

## 2018-08-14 VITALS — BP 150/75 | HR 64 | Ht 72.0 in | Wt 182.8 lb

## 2018-08-14 DIAGNOSIS — I10 Essential (primary) hypertension: Secondary | ICD-10-CM | POA: Diagnosis not present

## 2018-08-14 DIAGNOSIS — I6523 Occlusion and stenosis of bilateral carotid arteries: Secondary | ICD-10-CM | POA: Diagnosis not present

## 2018-08-14 DIAGNOSIS — I25119 Atherosclerotic heart disease of native coronary artery with unspecified angina pectoris: Secondary | ICD-10-CM | POA: Diagnosis not present

## 2018-08-14 NOTE — Patient Instructions (Addendum)
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
## 2018-10-14 DIAGNOSIS — Z6823 Body mass index (BMI) 23.0-23.9, adult: Secondary | ICD-10-CM | POA: Diagnosis not present

## 2018-10-14 DIAGNOSIS — I2510 Atherosclerotic heart disease of native coronary artery without angina pectoris: Secondary | ICD-10-CM | POA: Diagnosis not present

## 2018-10-14 DIAGNOSIS — E039 Hypothyroidism, unspecified: Secondary | ICD-10-CM | POA: Diagnosis not present

## 2018-10-14 DIAGNOSIS — D492 Neoplasm of unspecified behavior of bone, soft tissue, and skin: Secondary | ICD-10-CM | POA: Diagnosis not present

## 2018-10-14 DIAGNOSIS — I10 Essential (primary) hypertension: Secondary | ICD-10-CM | POA: Diagnosis not present

## 2018-10-14 DIAGNOSIS — Z299 Encounter for prophylactic measures, unspecified: Secondary | ICD-10-CM | POA: Diagnosis not present

## 2018-11-03 DIAGNOSIS — Z299 Encounter for prophylactic measures, unspecified: Secondary | ICD-10-CM | POA: Diagnosis not present

## 2018-11-03 DIAGNOSIS — E039 Hypothyroidism, unspecified: Secondary | ICD-10-CM | POA: Diagnosis not present

## 2018-11-03 DIAGNOSIS — L089 Local infection of the skin and subcutaneous tissue, unspecified: Secondary | ICD-10-CM | POA: Diagnosis not present

## 2018-11-03 DIAGNOSIS — L723 Sebaceous cyst: Secondary | ICD-10-CM | POA: Diagnosis not present

## 2018-11-03 DIAGNOSIS — I10 Essential (primary) hypertension: Secondary | ICD-10-CM | POA: Diagnosis not present

## 2018-11-03 DIAGNOSIS — D492 Neoplasm of unspecified behavior of bone, soft tissue, and skin: Secondary | ICD-10-CM | POA: Diagnosis not present

## 2018-11-03 DIAGNOSIS — Z6823 Body mass index (BMI) 23.0-23.9, adult: Secondary | ICD-10-CM | POA: Diagnosis not present

## 2018-11-09 DIAGNOSIS — L989 Disorder of the skin and subcutaneous tissue, unspecified: Secondary | ICD-10-CM | POA: Diagnosis not present

## 2018-11-11 DIAGNOSIS — D044 Carcinoma in situ of skin of scalp and neck: Secondary | ICD-10-CM | POA: Diagnosis not present

## 2018-11-11 DIAGNOSIS — L821 Other seborrheic keratosis: Secondary | ICD-10-CM | POA: Diagnosis not present

## 2018-11-12 DIAGNOSIS — E7800 Pure hypercholesterolemia, unspecified: Secondary | ICD-10-CM | POA: Diagnosis not present

## 2018-11-12 DIAGNOSIS — I10 Essential (primary) hypertension: Secondary | ICD-10-CM | POA: Diagnosis not present

## 2018-11-12 DIAGNOSIS — I2510 Atherosclerotic heart disease of native coronary artery without angina pectoris: Secondary | ICD-10-CM | POA: Diagnosis not present

## 2018-11-20 DIAGNOSIS — I10 Essential (primary) hypertension: Secondary | ICD-10-CM | POA: Diagnosis not present

## 2018-11-25 DIAGNOSIS — Z299 Encounter for prophylactic measures, unspecified: Secondary | ICD-10-CM | POA: Diagnosis not present

## 2018-11-25 DIAGNOSIS — Z6823 Body mass index (BMI) 23.0-23.9, adult: Secondary | ICD-10-CM | POA: Diagnosis not present

## 2018-11-25 DIAGNOSIS — Z713 Dietary counseling and surveillance: Secondary | ICD-10-CM | POA: Diagnosis not present

## 2018-11-25 DIAGNOSIS — I10 Essential (primary) hypertension: Secondary | ICD-10-CM | POA: Diagnosis not present

## 2018-11-25 DIAGNOSIS — E039 Hypothyroidism, unspecified: Secondary | ICD-10-CM | POA: Diagnosis not present

## 2018-11-30 DIAGNOSIS — C4492 Squamous cell carcinoma of skin, unspecified: Secondary | ICD-10-CM | POA: Diagnosis not present

## 2018-12-02 DIAGNOSIS — Z299 Encounter for prophylactic measures, unspecified: Secondary | ICD-10-CM | POA: Diagnosis not present

## 2018-12-02 DIAGNOSIS — I10 Essential (primary) hypertension: Secondary | ICD-10-CM | POA: Diagnosis not present

## 2018-12-02 DIAGNOSIS — Z6824 Body mass index (BMI) 24.0-24.9, adult: Secondary | ICD-10-CM | POA: Diagnosis not present

## 2018-12-02 DIAGNOSIS — Z713 Dietary counseling and surveillance: Secondary | ICD-10-CM | POA: Diagnosis not present

## 2018-12-10 DIAGNOSIS — I2510 Atherosclerotic heart disease of native coronary artery without angina pectoris: Secondary | ICD-10-CM | POA: Diagnosis not present

## 2018-12-10 DIAGNOSIS — I10 Essential (primary) hypertension: Secondary | ICD-10-CM | POA: Diagnosis not present

## 2018-12-10 DIAGNOSIS — E7800 Pure hypercholesterolemia, unspecified: Secondary | ICD-10-CM | POA: Diagnosis not present

## 2019-01-05 DIAGNOSIS — I10 Essential (primary) hypertension: Secondary | ICD-10-CM | POA: Diagnosis not present

## 2019-01-05 DIAGNOSIS — I2510 Atherosclerotic heart disease of native coronary artery without angina pectoris: Secondary | ICD-10-CM | POA: Diagnosis not present

## 2019-01-05 DIAGNOSIS — E7800 Pure hypercholesterolemia, unspecified: Secondary | ICD-10-CM | POA: Diagnosis not present

## 2019-01-13 DIAGNOSIS — I10 Essential (primary) hypertension: Secondary | ICD-10-CM | POA: Diagnosis not present

## 2019-01-13 DIAGNOSIS — Z6824 Body mass index (BMI) 24.0-24.9, adult: Secondary | ICD-10-CM | POA: Diagnosis not present

## 2019-01-13 DIAGNOSIS — Z299 Encounter for prophylactic measures, unspecified: Secondary | ICD-10-CM | POA: Diagnosis not present

## 2019-01-13 DIAGNOSIS — M542 Cervicalgia: Secondary | ICD-10-CM | POA: Diagnosis not present

## 2019-01-13 DIAGNOSIS — D492 Neoplasm of unspecified behavior of bone, soft tissue, and skin: Secondary | ICD-10-CM | POA: Diagnosis not present

## 2019-02-03 DIAGNOSIS — I2510 Atherosclerotic heart disease of native coronary artery without angina pectoris: Secondary | ICD-10-CM | POA: Diagnosis not present

## 2019-02-03 DIAGNOSIS — E7800 Pure hypercholesterolemia, unspecified: Secondary | ICD-10-CM | POA: Diagnosis not present

## 2019-02-03 DIAGNOSIS — I10 Essential (primary) hypertension: Secondary | ICD-10-CM | POA: Diagnosis not present

## 2019-03-23 DIAGNOSIS — I2510 Atherosclerotic heart disease of native coronary artery without angina pectoris: Secondary | ICD-10-CM | POA: Diagnosis not present

## 2019-03-23 DIAGNOSIS — I10 Essential (primary) hypertension: Secondary | ICD-10-CM | POA: Diagnosis not present

## 2019-03-23 DIAGNOSIS — E7800 Pure hypercholesterolemia, unspecified: Secondary | ICD-10-CM | POA: Diagnosis not present

## 2019-03-24 ENCOUNTER — Telehealth: Payer: Self-pay | Admitting: Cardiology

## 2019-03-24 NOTE — Telephone Encounter (Signed)
Virtual Visit Pre-Appointment Phone Call  "(Name), I am calling you today to discuss your upcoming appointment. We are currently trying to limit exposure to the virus that causes COVID-19 by seeing patients at home rather than in the office."  1. "What is the BEST phone number to call the day of the visit?" - include this in appointment notes  2. Do you have or have access to (through a family member/friend) a smartphone with video capability that we can use for your visit?" a. If yes - list this number in appt notes as cell (if different from BEST phone #) and list the appointment type as a VIDEO visit in appointment notes b. If no - list the appointment type as a PHONE visit in appointment notes  3. Confirm consent - "In the setting of the current Covid19 crisis, you are scheduled for a (phone or video) visit with your provider on (date) at (time).  Just as we do with many in-office visits, in order for you to participate in this visit, we must obtain consent.  If you'd like, I can send this to your mychart (if signed up) or email for you to review.  Otherwise, I can obtain your verbal consent now.  All virtual visits are billed to your insurance company just like a normal visit would be.  By agreeing to a virtual visit, we'd like you to understand that the technology does not allow for your provider to perform an examination, and thus may limit your provider's ability to fully assess your condition. If your provider identifies any concerns that need to be evaluated in person, we will make arrangements to do so.  Finally, though the technology is pretty good, we cannot assure that it will always work on either your or our end, and in the setting of a video visit, we may have to convert it to a phone-only visit.  In either situation, we cannot ensure that we have a secure connection.  Are you willing to proceed?" STAFF: Did the patient verbally acknowledge consent to telehealth visit? Document  YES/NO here: yes  4. Advise patient to be prepared - "Two hours prior to your appointment, go ahead and check your blood pressure, pulse, oxygen saturation, and your weight (if you have the equipment to check those) and write them all down. When your visit starts, your provider will ask you for this information. If you have an Apple Watch or Kardia device, please plan to have heart rate information ready on the day of your appointment. Please have a pen and paper handy nearby the day of the visit as well."  5. Give patient instructions for MyChart download to smartphone OR Doximity/Doxy.me as below if video visit (depending on what platform provider is using)  6. Inform patient they will receive a phone call 15 minutes prior to their appointment time (may be from unknown caller ID) so they should be prepared to answer    TELEPHONE CALL NOTE  Herbert Carr has been deemed a candidate for a follow-up tele-health visit to limit community exposure during the Covid-19 pandemic. I spoke with the patient via phone to ensure availability of phone/video source, confirm preferred email & phone number, and discuss instructions and expectations.  I reminded Herbert Carr to be prepared with any vital sign and/or heart rhythm information that could potentially be obtained via home monitoring, at the time of his visit. I reminded Herbert Carr to expect a phone call prior to  his visit.  [name] 03/24/2019 12:37 PM   INSTRUCTIONS FOR DOWNLOADING THE MYCHART APP TO SMARTPHONE  - The patient must first make sure to have activated MyChart and know their login information - If Apple, go to [employer] and type in MyChart in the search bar and download the app. If Android, ask patient to go to [employer] and type in Venus in the search bar and download the app. The app is free but as with any other app downloads, their phone may require them to verify saved payment information or Apple/Android  password.  - The patient will need to then log into the app with their MyChart username and password, and select [facility] as their healthcare provider to link the account. When it is time for your visit, go to the MyChart app, find appointments, and click Begin Video Visit. Be sure to Select Allow for your device to access the Microphone and Camera for your visit. You will then be connected, and your provider will be with you shortly.  **If they have any issues connecting, or need assistance please contact MyChart service desk [phone])**  **If using a computer, in order to ensure the best quality for their visit they will need to use either of the following Internet Browsers: [employer], or Google Chrome**  IF USING DOXIMITY or DOXY.ME - The patient will receive a link just prior to their visit by text.     FULL LENGTH CONSENT FOR TELE-HEALTH VISIT   I hereby voluntarily request, consent and authorize [facility] and its employed or contracted physicians, physician assistants, nurse practitioners or other licensed health care professionals (the Practitioner), to provide me with telemedicine health care services (the Services") as deemed necessary by the treating Practitioner. I acknowledge and consent to receive the Services by the Practitioner via telemedicine. I understand that the telemedicine visit will involve communicating with the Practitioner through live audiovisual communication technology and the disclosure of certain medical information by electronic transmission. I acknowledge that I have been given the opportunity to request an in-person assessment or other available alternative prior to the telemedicine visit and am voluntarily participating in the telemedicine visit.  I understand that I have the right to withhold or withdraw my consent to the use of telemedicine in the course of my care at any time, without affecting my right to future care or treatment,  and that the Practitioner or I may terminate the telemedicine visit at any time. I understand that I have the right to inspect all information obtained and/or recorded in the course of the telemedicine visit and may receive copies of available information for a reasonable fee.  I understand that some of the potential risks of receiving the Services via telemedicine include:   Delay or interruption in medical evaluation due to technological equipment failure or disruption;  Information transmitted may not be sufficient (e.g. poor resolution of images) to allow for appropriate medical decision making by the Practitioner; and/or   In rare instances, security protocols could fail, causing a breach of personal health information.  Furthermore, I acknowledge that it is my responsibility to provide information about my medical history, conditions and care that is complete and accurate to the best of my ability. I acknowledge that Practitioner's advice, recommendations, and/or decision may be based on factors not within their control, such as incomplete or inaccurate data provided by me or distortions of diagnostic images or specimens that may result from electronic transmissions. I  understand that the practice of medicine is not an exact science and that Practitioner makes no warranties or guarantees regarding treatment outcomes. I acknowledge that I will receive a copy of this consent concurrently upon execution via email to the email address I last provided but may also request a printed copy by calling the office of [place].    I understand that my insurance will be billed for this visit.   I have read or had this consent read to me.  I understand the contents of this consent, which adequately explains the benefits and risks of the Services being provided via telemedicine.   I have been provided ample opportunity to ask questions regarding this consent and the Services and have had my questions  answered to my satisfaction.  I give my informed consent for the services to be provided through the use of telemedicine in my medical care  By participating in this telemedicine visit I agree to the above.

## 2019-03-26 ENCOUNTER — Encounter: Payer: Self-pay | Admitting: Cardiology

## 2019-03-26 ENCOUNTER — Telehealth (INDEPENDENT_AMBULATORY_CARE_PROVIDER_SITE_OTHER): Payer: Medicare HMO | Admitting: Cardiology

## 2019-03-26 VITALS — BP 124/59 | Ht 72.0 in | Wt 178.0 lb

## 2019-03-26 DIAGNOSIS — I10 Essential (primary) hypertension: Secondary | ICD-10-CM | POA: Diagnosis not present

## 2019-03-26 DIAGNOSIS — I25119 Atherosclerotic heart disease of native coronary artery with unspecified angina pectoris: Secondary | ICD-10-CM

## 2019-03-26 DIAGNOSIS — I6523 Occlusion and stenosis of bilateral carotid arteries: Secondary | ICD-10-CM
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
## 2019-03-26 NOTE — Patient Instructions (Signed)

## 2019-04-23 DIAGNOSIS — I2510 Atherosclerotic heart disease of native coronary artery without angina pectoris: Secondary | ICD-10-CM | POA: Diagnosis not present

## 2019-04-23 DIAGNOSIS — E7800 Pure hypercholesterolemia, unspecified: Secondary | ICD-10-CM | POA: Diagnosis not present

## 2019-04-23 DIAGNOSIS — I10 Essential (primary) hypertension: Secondary | ICD-10-CM | POA: Diagnosis not present

## 2019-05-11 DIAGNOSIS — Z299 Encounter for prophylactic measures, unspecified: Secondary | ICD-10-CM | POA: Diagnosis not present

## 2019-05-11 DIAGNOSIS — E7800 Pure hypercholesterolemia, unspecified: Secondary | ICD-10-CM | POA: Diagnosis not present

## 2019-05-11 DIAGNOSIS — Z789 Other specified health status: Secondary | ICD-10-CM | POA: Diagnosis not present

## 2019-05-11 DIAGNOSIS — I10 Essential (primary) hypertension: Secondary | ICD-10-CM | POA: Diagnosis not present

## 2019-05-11 DIAGNOSIS — F419 Anxiety disorder, unspecified: Secondary | ICD-10-CM | POA: Diagnosis not present

## 2019-06-08 ENCOUNTER — Ambulatory Visit: Payer: Medicare HMO | Attending: Internal Medicine

## 2019-06-08 ENCOUNTER — Other Ambulatory Visit: Payer: Self-pay

## 2019-06-08 DIAGNOSIS — Z20828 Contact with and (suspected) exposure to other viral communicable diseases: Secondary | ICD-10-CM | POA: Diagnosis not present

## 2019-06-08 DIAGNOSIS — Z20822 Contact with and (suspected) exposure to covid-19: Secondary | ICD-10-CM

## 2019-06-09 ENCOUNTER — Telehealth: Payer: Self-pay | Admitting: *Deleted

## 2019-06-09 LAB — NOVEL CORONAVIRUS, NAA: SARS-CoV-2, NAA: NOT DETECTED

## 2019-06-09 NOTE — Telephone Encounter (Signed)
Patient's wife called for results ,still pending . 

## 2019-06-16 DIAGNOSIS — E7800 Pure hypercholesterolemia, unspecified: Secondary | ICD-10-CM | POA: Diagnosis not present

## 2019-06-16 DIAGNOSIS — I10 Essential (primary) hypertension: Secondary | ICD-10-CM | POA: Diagnosis not present

## 2019-06-16 DIAGNOSIS — I2510 Atherosclerotic heart disease of native coronary artery without angina pectoris: Secondary | ICD-10-CM | POA: Diagnosis not present

## 2019-07-06 DIAGNOSIS — I2510 Atherosclerotic heart disease of native coronary artery without angina pectoris: Secondary | ICD-10-CM | POA: Diagnosis not present

## 2019-07-06 DIAGNOSIS — E7800 Pure hypercholesterolemia, unspecified: Secondary | ICD-10-CM | POA: Diagnosis not present

## 2019-07-06 DIAGNOSIS — I10 Essential (primary) hypertension: Secondary | ICD-10-CM | POA: Diagnosis not present

## 2019-07-16 DIAGNOSIS — H6123 Impacted cerumen, bilateral: Secondary | ICD-10-CM | POA: Diagnosis not present

## 2019-07-16 DIAGNOSIS — R070 Pain in throat: Secondary | ICD-10-CM | POA: Diagnosis not present

## 2019-07-16 DIAGNOSIS — J310 Chronic rhinitis: Secondary | ICD-10-CM | POA: Diagnosis not present

## 2019-07-16 DIAGNOSIS — R0982 Postnasal drip: Secondary | ICD-10-CM | POA: Diagnosis not present

## 2019-07-16 DIAGNOSIS — J342 Deviated nasal septum: Secondary | ICD-10-CM | POA: Diagnosis not present

## 2019-07-26 DIAGNOSIS — Z789 Other specified health status: Secondary | ICD-10-CM | POA: Diagnosis not present

## 2019-07-26 DIAGNOSIS — Z6823 Body mass index (BMI) 23.0-23.9, adult: Secondary | ICD-10-CM | POA: Diagnosis not present

## 2019-07-26 DIAGNOSIS — I10 Essential (primary) hypertension: Secondary | ICD-10-CM | POA: Diagnosis not present

## 2019-07-26 DIAGNOSIS — Z299 Encounter for prophylactic measures, unspecified: Secondary | ICD-10-CM | POA: Diagnosis not present

## 2019-07-26 DIAGNOSIS — G4700 Insomnia, unspecified: Secondary | ICD-10-CM | POA: Diagnosis not present

## 2019-07-26 DIAGNOSIS — I219 Acute myocardial infarction, unspecified: Secondary | ICD-10-CM | POA: Diagnosis not present

## 2019-08-04 DIAGNOSIS — E7800 Pure hypercholesterolemia, unspecified: Secondary | ICD-10-CM | POA: Diagnosis not present

## 2019-08-04 DIAGNOSIS — I2510 Atherosclerotic heart disease of native coronary artery without angina pectoris: Secondary | ICD-10-CM | POA: Diagnosis not present

## 2019-08-04 DIAGNOSIS — I10 Essential (primary) hypertension: Secondary | ICD-10-CM | POA: Diagnosis not present

## 2019-08-19 DIAGNOSIS — E7800 Pure hypercholesterolemia, unspecified: Secondary | ICD-10-CM | POA: Diagnosis not present

## 2019-08-19 DIAGNOSIS — Z125 Encounter for screening for malignant neoplasm of prostate: Secondary | ICD-10-CM | POA: Diagnosis not present

## 2019-08-19 DIAGNOSIS — E039 Hypothyroidism, unspecified: Secondary | ICD-10-CM | POA: Diagnosis not present

## 2019-08-19 DIAGNOSIS — Z1211 Encounter for screening for malignant neoplasm of colon: Secondary | ICD-10-CM | POA: Diagnosis not present

## 2019-08-19 DIAGNOSIS — Z79899 Other long term (current) drug therapy: Secondary | ICD-10-CM | POA: Diagnosis not present

## 2019-08-19 DIAGNOSIS — I10 Essential (primary) hypertension: Secondary | ICD-10-CM | POA: Diagnosis not present

## 2019-08-19 DIAGNOSIS — Z299 Encounter for prophylactic measures, unspecified: Secondary | ICD-10-CM | POA: Diagnosis not present

## 2019-08-19 DIAGNOSIS — Z1331 Encounter for screening for depression: Secondary | ICD-10-CM | POA: Diagnosis not present

## 2019-08-19 DIAGNOSIS — Z7189 Other specified counseling: Secondary | ICD-10-CM | POA: Diagnosis not present

## 2019-08-19 DIAGNOSIS — Z1339 Encounter for screening examination for other mental health and behavioral disorders: Secondary | ICD-10-CM | POA: Diagnosis not present

## 2019-08-19 DIAGNOSIS — R5383 Other fatigue: Secondary | ICD-10-CM | POA: Diagnosis not present

## 2019-08-19 DIAGNOSIS — Z6823 Body mass index (BMI) 23.0-23.9, adult: Secondary | ICD-10-CM | POA: Diagnosis not present

## 2019-08-19 DIAGNOSIS — Z0000 Encounter for general adult medical examination without abnormal findings: Secondary | ICD-10-CM | POA: Diagnosis not present

## 2019-09-16 DIAGNOSIS — I10 Essential (primary) hypertension: Secondary | ICD-10-CM | POA: Diagnosis not present

## 2019-09-16 DIAGNOSIS — E7800 Pure hypercholesterolemia, unspecified: Secondary | ICD-10-CM | POA: Diagnosis not present

## 2019-09-16 DIAGNOSIS — I2510 Atherosclerotic heart disease of native coronary artery without angina pectoris: Secondary | ICD-10-CM | POA: Diagnosis not present

## 2019-09-28 ENCOUNTER — Other Ambulatory Visit: Payer: Self-pay

## 2019-09-28 ENCOUNTER — Ambulatory Visit: Payer: Medicare HMO | Admitting: Cardiology

## 2019-09-28 ENCOUNTER — Encounter: Payer: Self-pay | Admitting: Cardiology

## 2019-09-28 VITALS — BP 138/70 | HR 75 | Ht 72.0 in | Wt 176.0 lb

## 2019-09-28 DIAGNOSIS — R002 Palpitations: Secondary | ICD-10-CM | POA: Diagnosis not present

## 2019-09-28 DIAGNOSIS — I10 Essential (primary) hypertension: Secondary | ICD-10-CM | POA: Diagnosis not present

## 2019-09-28 DIAGNOSIS — Z8546 Personal history of malignant neoplasm of prostate: Secondary | ICD-10-CM | POA: Diagnosis not present

## 2019-09-28 DIAGNOSIS — R0989 Other specified symptoms and signs involving the circulatory and respiratory systems: Secondary | ICD-10-CM | POA: Diagnosis not present

## 2019-09-28 DIAGNOSIS — I2510 Atherosclerotic heart disease of native coronary artery without angina pectoris: Secondary | ICD-10-CM

## 2019-09-28 DIAGNOSIS — H5440 Blindness, one eye, unspecified eye: Secondary | ICD-10-CM | POA: Diagnosis not present

## 2019-09-28 DIAGNOSIS — Z9861 Coronary angioplasty status: Secondary | ICD-10-CM

## 2019-09-28 NOTE — Addendum Note (Signed)
Addended by: Laurine Blazer on: 09/28/2019 03:14 PM   Modules accepted: Orders

## 2019-09-28 NOTE — Assessment & Plan Note (Signed)
H/O LAD PCI/DES Oct 2018
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
## 2019-09-28 NOTE — Patient Instructions (Signed)

## 2019-09-28 NOTE — Assessment & Plan Note (Signed)
Dopplers in 2018- 1-39% bilateral ICA stenosis

## 2019-09-28 NOTE — Assessment & Plan Note (Signed)
Bothers him very little now

## 2019-09-28 NOTE — Assessment & Plan Note (Signed)
Echo 2018- EF 55-60% with apical AK and grade 2 DD

## 2019-09-28 NOTE — Assessment & Plan Note (Signed)
H/O seed implant

## 2019-09-29 NOTE — Progress Notes (Signed)
Hayley can you schedule Mr Vevelyn Royals for carotid dopplers.  He has a right carotid bruit.  Dr Domenic Polite would like this done.  Kerin Ransom PA-C 09/29/2019 9:12 AM

## 2019-09-30 ENCOUNTER — Telehealth: Payer: Self-pay | Admitting: *Deleted

## 2019-09-30 DIAGNOSIS — R0989 Other specified symptoms and signs involving the circulatory and respiratory systems: Secondary | ICD-10-CM

## 2019-09-30 DIAGNOSIS — I6523 Occlusion and stenosis of bilateral carotid arteries: Secondary | ICD-10-CM

## 2019-09-30 NOTE — Telephone Encounter (Signed)
Order placed for carotid dopplers per L. Kilroy PA.

## 2019-09-30 NOTE — Telephone Encounter (Signed)
-----   Message from Erlene Quan, [place] sent at 09/29/2019  9:11 AM EDT -----   ----- Message ----- From: Satira Sark, MD Sent: 09/28/2019   7:33 PM EDT To: Erlene Quan, PA-C  Yes, go ahead and repeat carotid Dopplers since last study was in 2018, although with bruit could be due to tortuous artery without stenosis. ----- Message ----- From: Ilean China Sent: 09/28/2019  11:35 AM EDT To: Satira Sark, MD  He does have a Rt carotid bruit- dopplers OK in Dec 2018, didn't know if you wanted to repeat those or just follow.  He is asymptomatic.

## 2019-10-14 DIAGNOSIS — E7800 Pure hypercholesterolemia, unspecified: Secondary | ICD-10-CM | POA: Diagnosis not present

## 2019-10-14 DIAGNOSIS — R070 Pain in throat: Secondary | ICD-10-CM | POA: Diagnosis not present

## 2019-10-14 DIAGNOSIS — I2510 Atherosclerotic heart disease of native coronary artery without angina pectoris: Secondary | ICD-10-CM | POA: Diagnosis not present

## 2019-10-14 DIAGNOSIS — K219 Gastro-esophageal reflux disease without esophagitis: Secondary | ICD-10-CM | POA: Diagnosis not present

## 2019-10-14 DIAGNOSIS — I10 Essential (primary) hypertension: Secondary | ICD-10-CM | POA: Diagnosis not present

## 2019-11-21 DIAGNOSIS — I2510 Atherosclerotic heart disease of native coronary artery without angina pectoris: Secondary | ICD-10-CM | POA: Diagnosis not present

## 2019-11-21 DIAGNOSIS — I10 Essential (primary) hypertension: Secondary | ICD-10-CM | POA: Diagnosis not present

## 2019-11-21 DIAGNOSIS — E7800 Pure hypercholesterolemia, unspecified: Secondary | ICD-10-CM | POA: Diagnosis not present

## 2019-12-01 DIAGNOSIS — I10 Essential (primary) hypertension: Secondary | ICD-10-CM | POA: Diagnosis not present

## 2019-12-01 DIAGNOSIS — Z299 Encounter for prophylactic measures, unspecified: Secondary | ICD-10-CM | POA: Diagnosis not present

## 2019-12-01 DIAGNOSIS — E039 Hypothyroidism, unspecified: Secondary | ICD-10-CM | POA: Diagnosis not present

## 2019-12-01 DIAGNOSIS — I2510 Atherosclerotic heart disease of native coronary artery without angina pectoris: Secondary | ICD-10-CM | POA: Diagnosis not present

## 2019-12-16 DIAGNOSIS — R070 Pain in throat: Secondary | ICD-10-CM | POA: Diagnosis not present

## 2019-12-16 DIAGNOSIS — K219 Gastro-esophageal reflux disease without esophagitis: Secondary | ICD-10-CM | POA: Diagnosis not present

## 2019-12-22 DIAGNOSIS — E7800 Pure hypercholesterolemia, unspecified: Secondary | ICD-10-CM | POA: Diagnosis not present

## 2019-12-22 DIAGNOSIS — I10 Essential (primary) hypertension: Secondary | ICD-10-CM | POA: Diagnosis not present

## 2019-12-22 DIAGNOSIS — I2510 Atherosclerotic heart disease of native coronary artery without angina pectoris: Secondary | ICD-10-CM | POA: Diagnosis not present

## 2020-01-02 DIAGNOSIS — I10 Essential (primary) hypertension: Secondary | ICD-10-CM | POA: Diagnosis not present

## 2020-01-02 DIAGNOSIS — M25512 Pain in left shoulder: Secondary | ICD-10-CM | POA: Diagnosis not present

## 2020-01-02 DIAGNOSIS — M546 Pain in thoracic spine: Secondary | ICD-10-CM | POA: Diagnosis not present

## 2020-01-02 DIAGNOSIS — Z923 Personal history of irradiation: Secondary | ICD-10-CM | POA: Diagnosis not present

## 2020-01-02 DIAGNOSIS — Z8546 Personal history of malignant neoplasm of prostate: Secondary | ICD-10-CM | POA: Diagnosis not present

## 2020-01-02 DIAGNOSIS — Z955 Presence of coronary angioplasty implant and graft: Secondary | ICD-10-CM | POA: Diagnosis not present

## 2020-01-02 DIAGNOSIS — E039 Hypothyroidism, unspecified: Secondary | ICD-10-CM | POA: Diagnosis not present

## 2020-01-02 DIAGNOSIS — I2510 Atherosclerotic heart disease of native coronary artery without angina pectoris: Secondary | ICD-10-CM | POA: Diagnosis not present

## 2020-01-02 DIAGNOSIS — J811 Chronic pulmonary edema: Secondary | ICD-10-CM | POA: Diagnosis not present

## 2020-01-19 DIAGNOSIS — Z299 Encounter for prophylactic measures, unspecified: Secondary | ICD-10-CM | POA: Diagnosis not present

## 2020-01-19 DIAGNOSIS — D692 Other nonthrombocytopenic purpura: Secondary | ICD-10-CM | POA: Diagnosis not present

## 2020-01-19 DIAGNOSIS — R350 Frequency of micturition: Secondary | ICD-10-CM | POA: Diagnosis not present

## 2020-01-19 DIAGNOSIS — N390 Urinary tract infection, site not specified: Secondary | ICD-10-CM | POA: Diagnosis not present

## 2020-01-19 DIAGNOSIS — M25512 Pain in left shoulder: Secondary | ICD-10-CM | POA: Diagnosis not present

## 2020-01-19 DIAGNOSIS — E119 Type 2 diabetes mellitus without complications: Secondary | ICD-10-CM | POA: Diagnosis not present

## 2020-01-19 DIAGNOSIS — I10 Essential (primary) hypertension: Secondary | ICD-10-CM | POA: Diagnosis not present

## 2020-01-19 DIAGNOSIS — I2510 Atherosclerotic heart disease of native coronary artery without angina pectoris: Secondary | ICD-10-CM | POA: Diagnosis not present

## 2020-03-14 DIAGNOSIS — I2510 Atherosclerotic heart disease of native coronary artery without angina pectoris: Secondary | ICD-10-CM | POA: Diagnosis not present

## 2020-03-14 DIAGNOSIS — L989 Disorder of the skin and subcutaneous tissue, unspecified: Secondary | ICD-10-CM | POA: Diagnosis not present

## 2020-03-14 DIAGNOSIS — Z6822 Body mass index (BMI) 22.0-22.9, adult: Secondary | ICD-10-CM | POA: Diagnosis not present

## 2020-03-14 DIAGNOSIS — L409 Psoriasis, unspecified: Secondary | ICD-10-CM | POA: Diagnosis not present

## 2020-03-14 DIAGNOSIS — I10 Essential (primary) hypertension: Secondary | ICD-10-CM | POA: Diagnosis not present

## 2020-03-14 DIAGNOSIS — E039 Hypothyroidism, unspecified: Secondary | ICD-10-CM | POA: Diagnosis not present

## 2020-03-28 DIAGNOSIS — I2510 Atherosclerotic heart disease of native coronary artery without angina pectoris: Secondary | ICD-10-CM | POA: Diagnosis not present

## 2020-03-28 DIAGNOSIS — I10 Essential (primary) hypertension: Secondary | ICD-10-CM | POA: Diagnosis not present

## 2020-03-28 DIAGNOSIS — E039 Hypothyroidism, unspecified: Secondary | ICD-10-CM | POA: Diagnosis not present

## 2020-03-28 DIAGNOSIS — L409 Psoriasis, unspecified: Secondary | ICD-10-CM | POA: Diagnosis not present

## 2020-03-28 DIAGNOSIS — L989 Disorder of the skin and subcutaneous tissue, unspecified: Secondary | ICD-10-CM | POA: Diagnosis not present

## 2020-03-28 DIAGNOSIS — Z6822 Body mass index (BMI) 22.0-22.9, adult: Secondary | ICD-10-CM | POA: Diagnosis not present

## 2020-09-12 DIAGNOSIS — G4700 Insomnia, unspecified: Secondary | ICD-10-CM | POA: Diagnosis not present

## 2020-09-12 DIAGNOSIS — K529 Noninfective gastroenteritis and colitis, unspecified: Secondary | ICD-10-CM | POA: Diagnosis not present

## 2020-09-22 DIAGNOSIS — E039 Hypothyroidism, unspecified: Secondary | ICD-10-CM | POA: Diagnosis not present

## 2020-09-25 DIAGNOSIS — Z6821 Body mass index (BMI) 21.0-21.9, adult: Secondary | ICD-10-CM | POA: Diagnosis not present

## 2020-09-25 DIAGNOSIS — L989 Disorder of the skin and subcutaneous tissue, unspecified: Secondary | ICD-10-CM | POA: Diagnosis not present

## 2020-09-25 DIAGNOSIS — L409 Psoriasis, unspecified: Secondary | ICD-10-CM | POA: Diagnosis not present

## 2020-09-25 DIAGNOSIS — E039 Hypothyroidism, unspecified: Secondary | ICD-10-CM | POA: Diagnosis not present

## 2020-09-25 DIAGNOSIS — I10 Essential (primary) hypertension: Secondary | ICD-10-CM | POA: Diagnosis not present

## 2020-09-25 DIAGNOSIS — I2510 Atherosclerotic heart disease of native coronary artery without angina pectoris: Secondary | ICD-10-CM | POA: Diagnosis not present

## 2020-11-21 ENCOUNTER — Telehealth: Payer: Self-pay | Admitting: Cardiology

## 2020-11-21 NOTE — Telephone Encounter (Signed)
Pt booked with Dr. Domenic Polite on 11/23/20 at 8:20 am.

## 2020-11-21 NOTE — Telephone Encounter (Signed)
Patient is requesting an appointment with Dr. Domenic Polite.  Heart has been fluttering for 2 wks and is belching a lot.  No openings this week for MD or APP. Please advise.
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
## 2020-11-23 ENCOUNTER — Ambulatory Visit: Payer: Medicare HMO | Admitting: Cardiology

## 2020-11-23 ENCOUNTER — Other Ambulatory Visit: Payer: Self-pay | Admitting: Cardiology

## 2020-11-23 ENCOUNTER — Telehealth: Payer: Self-pay | Admitting: Cardiology

## 2020-11-23 ENCOUNTER — Ambulatory Visit (INDEPENDENT_AMBULATORY_CARE_PROVIDER_SITE_OTHER): Payer: Medicare HMO

## 2020-11-23 ENCOUNTER — Encounter: Payer: Self-pay | Admitting: Cardiology

## 2020-11-23 VITALS — BP 126/70 | HR 74 | Ht 72.0 in | Wt 161.0 lb

## 2020-11-23 DIAGNOSIS — R002 Palpitations: Secondary | ICD-10-CM

## 2020-11-23 DIAGNOSIS — I25119 Atherosclerotic heart disease of native coronary artery with unspecified angina pectoris: Secondary | ICD-10-CM | POA: Diagnosis not present

## 2020-11-23 DIAGNOSIS — I10 Essential (primary) hypertension: Secondary | ICD-10-CM | POA: Diagnosis not present

## 2020-11-23 DIAGNOSIS — E119 Type 2 diabetes mellitus without complications: Secondary | ICD-10-CM | POA: Diagnosis not present

## 2020-11-23 NOTE — Patient Instructions (Signed)
Your physician recommends that you schedule a follow-up appointment in: [place]  Your physician recommends that you continue on your current medications as directed. Please refer to the Current Medication list given to you today.  [place]  Thank you for choosing [facility]!!

## 2020-11-23 NOTE — Addendum Note (Signed)
Addended by: Berlinda Last on: 11/23/2020 09:39 AM   Modules accepted: Orders

## 2020-11-23 NOTE — Telephone Encounter (Signed)
Pre-cert Verification for the following procedure     72 ZIO MONITOR

## 2020-11-26 DIAGNOSIS — R002 Palpitations: Secondary | ICD-10-CM | POA: Diagnosis not present

## 2020-11-30 DIAGNOSIS — R002 Palpitations: Secondary | ICD-10-CM | POA: Diagnosis not present

## 2020-12-01 ENCOUNTER — Telehealth: Payer: Self-pay | Admitting: *Deleted

## 2020-12-01 NOTE — Telephone Encounter (Signed)
-----   Message from Satira Sark, MD sent at 12/01/2020 12:39 PM EDT ----- Results reviewed.  Please let him know that the monitor did not show any atrial fibrillation which was one of her main concerns.  He does have rather relatively frequent PVCs which may be feeling as palpitations.  Let's try and increase his Lopressor to 1-1/2 tablets twice daily for now.  Schedule follow-up in the next 6 weeks.

## 2020-12-01 NOTE — Telephone Encounter (Signed)
Patient informed and Reports taking lopressor 25 mg by mouth daily vs twice daily. Copy sent to PCP

## 2020-12-04 ENCOUNTER — Telehealth: Payer: Self-pay | Admitting: Cardiology

## 2020-12-04 MED ORDER — METOPROLOL TARTRATE 25 MG PO TABS: 25.0000 mg | ORAL_TABLET | Freq: Two times a day (BID) | ORAL | 3 refills | Status: DC

## 2020-12-04 MED ORDER — METOPROLOL TARTRATE 25 MG PO TABS
25.0000 mg | ORAL_TABLET | Freq: Two times a day (BID) | ORAL | 3 refills | Status: DC
Start: 2020-12-04 — End: 2020-12-04

## 2020-12-04 NOTE — Telephone Encounter (Signed)
    1. Which medications need to be refilled? (please list name of each medication and dose if known) lOPRESSOR 25 MG   2. Which pharmacy/location (including street and city if local pharmacy) is medication to be sent to? [facility]   3. Do they need a 30 day or 90 day supply? 90   Patient is requesting prescription sent to [facility] .    Patient states that his medication was increaed to 2 tabs daily.    [phone] (patient)

## 2020-12-04 NOTE — Telephone Encounter (Signed)
Per [facility] you for clarifying, he should be taking Lopressor 25 mg twice daily, this may be why he has had more symptoms at nighttime.          Patient informed to take lopressor 25 mg twice daily. Verbalized understanding of plan.

## 2020-12-05 ENCOUNTER — Encounter: Payer: Self-pay | Admitting: Gastroenterology

## 2020-12-05 DIAGNOSIS — R3589 Other polyuria: Secondary | ICD-10-CM | POA: Diagnosis not present

## 2020-12-05 DIAGNOSIS — L989 Disorder of the skin and subcutaneous tissue, unspecified: Secondary | ICD-10-CM | POA: Diagnosis not present

## 2020-12-05 DIAGNOSIS — I10 Essential (primary) hypertension: Secondary | ICD-10-CM | POA: Diagnosis not present

## 2020-12-05 DIAGNOSIS — Z6820 Body mass index (BMI) 20.0-20.9, adult: Secondary | ICD-10-CM | POA: Diagnosis not present

## 2020-12-05 DIAGNOSIS — E039 Hypothyroidism, unspecified: Secondary | ICD-10-CM | POA: Diagnosis not present

## 2020-12-05 DIAGNOSIS — R739 Hyperglycemia, unspecified: Secondary | ICD-10-CM | POA: Diagnosis not present

## 2020-12-05 DIAGNOSIS — L409 Psoriasis, unspecified: Secondary | ICD-10-CM | POA: Diagnosis not present

## 2020-12-05 DIAGNOSIS — I2510 Atherosclerotic heart disease of native coronary artery without angina pectoris: Secondary | ICD-10-CM | POA: Diagnosis not present

## 2020-12-11 ENCOUNTER — Emergency Department (HOSPITAL_COMMUNITY): Payer: Medicare HMO

## 2020-12-11 ENCOUNTER — Emergency Department (HOSPITAL_COMMUNITY)
Admission: EM | Admit: 2020-12-11 | Discharge: 2020-12-12 | Discharge status: Against Medical Advice | Payer: Medicare HMO | Source: Home / Self Care | Attending: Emergency Medicine | Admitting: Emergency Medicine

## 2020-12-11 ENCOUNTER — Encounter (HOSPITAL_COMMUNITY): Payer: Self-pay

## 2020-12-11 DIAGNOSIS — K8020 Calculus of gallbladder without cholecystitis without obstruction: Secondary | ICD-10-CM | POA: Diagnosis not present

## 2020-12-11 DIAGNOSIS — R945 Abnormal results of liver function studies: Secondary | ICD-10-CM | POA: Diagnosis not present

## 2020-12-11 DIAGNOSIS — Z20822 Contact with and (suspected) exposure to covid-19: Secondary | ICD-10-CM | POA: Insufficient documentation

## 2020-12-11 DIAGNOSIS — R6510 Systemic inflammatory response syndrome (SIRS) of non-infectious origin without acute organ dysfunction: Secondary | ICD-10-CM | POA: Diagnosis not present

## 2020-12-11 DIAGNOSIS — K810 Acute cholecystitis: Secondary | ICD-10-CM | POA: Diagnosis not present

## 2020-12-11 DIAGNOSIS — K753 Granulomatous hepatitis, not elsewhere classified: Secondary | ICD-10-CM | POA: Diagnosis not present

## 2020-12-11 DIAGNOSIS — Z970 Presence of artificial eye: Secondary | ICD-10-CM | POA: Diagnosis not present

## 2020-12-11 DIAGNOSIS — Z8249 Family history of ischemic heart disease and other diseases of the circulatory system: Secondary | ICD-10-CM | POA: Diagnosis not present

## 2020-12-11 DIAGNOSIS — K219 Gastro-esophageal reflux disease without esophagitis: Secondary | ICD-10-CM | POA: Diagnosis not present

## 2020-12-11 DIAGNOSIS — Z806 Family history of leukemia: Secondary | ICD-10-CM | POA: Diagnosis not present

## 2020-12-11 DIAGNOSIS — I700 Atherosclerosis of aorta: Secondary | ICD-10-CM | POA: Diagnosis not present

## 2020-12-11 DIAGNOSIS — R7881 Bacteremia: Secondary | ICD-10-CM | POA: Diagnosis not present

## 2020-12-11 DIAGNOSIS — I2510 Atherosclerotic heart disease of native coronary artery without angina pectoris: Secondary | ICD-10-CM | POA: Diagnosis present

## 2020-12-11 DIAGNOSIS — K831 Obstruction of bile duct: Secondary | ICD-10-CM | POA: Diagnosis not present

## 2020-12-11 DIAGNOSIS — A419 Sepsis, unspecified organism: Secondary | ICD-10-CM | POA: Diagnosis not present

## 2020-12-11 DIAGNOSIS — R7989 Other specified abnormal findings of blood chemistry: Secondary | ICD-10-CM | POA: Diagnosis not present

## 2020-12-11 DIAGNOSIS — K8032 Calculus of bile duct with acute cholangitis without obstruction: Secondary | ICD-10-CM | POA: Diagnosis present

## 2020-12-11 DIAGNOSIS — I10 Essential (primary) hypertension: Secondary | ICD-10-CM | POA: Diagnosis present

## 2020-12-11 DIAGNOSIS — D638 Anemia in other chronic diseases classified elsewhere: Secondary | ICD-10-CM | POA: Diagnosis not present

## 2020-12-11 DIAGNOSIS — K8309 Other cholangitis: Secondary | ICD-10-CM | POA: Diagnosis not present

## 2020-12-11 DIAGNOSIS — K8000 Calculus of gallbladder with acute cholecystitis without obstruction: Secondary | ICD-10-CM | POA: Diagnosis not present

## 2020-12-11 DIAGNOSIS — G4700 Insomnia, unspecified: Secondary | ICD-10-CM | POA: Diagnosis present

## 2020-12-11 DIAGNOSIS — R002 Palpitations: Secondary | ICD-10-CM | POA: Diagnosis not present

## 2020-12-11 DIAGNOSIS — Z8546 Personal history of malignant neoplasm of prostate: Secondary | ICD-10-CM | POA: Diagnosis not present

## 2020-12-11 DIAGNOSIS — F419 Anxiety disorder, unspecified: Secondary | ICD-10-CM | POA: Diagnosis present

## 2020-12-11 DIAGNOSIS — A4159 Other Gram-negative sepsis: Secondary | ICD-10-CM | POA: Diagnosis present

## 2020-12-11 DIAGNOSIS — E279 Disorder of adrenal gland, unspecified: Secondary | ICD-10-CM | POA: Diagnosis not present

## 2020-12-11 DIAGNOSIS — R6520 Severe sepsis without septic shock: Secondary | ICD-10-CM | POA: Diagnosis present

## 2020-12-11 DIAGNOSIS — R202 Paresthesia of skin: Secondary | ICD-10-CM | POA: Diagnosis not present

## 2020-12-11 DIAGNOSIS — R251 Tremor, unspecified: Secondary | ICD-10-CM | POA: Insufficient documentation

## 2020-12-11 DIAGNOSIS — R634 Abnormal weight loss: Secondary | ICD-10-CM | POA: Diagnosis present

## 2020-12-11 DIAGNOSIS — Z6821 Body mass index (BMI) 21.0-21.9, adult: Secondary | ICD-10-CM | POA: Diagnosis not present

## 2020-12-11 DIAGNOSIS — K8050 Calculus of bile duct without cholangitis or cholecystitis without obstruction: Secondary | ICD-10-CM | POA: Diagnosis not present

## 2020-12-11 DIAGNOSIS — Z955 Presence of coronary angioplasty implant and graft: Secondary | ICD-10-CM | POA: Diagnosis not present

## 2020-12-11 DIAGNOSIS — K8012 Calculus of gallbladder with acute and chronic cholecystitis without obstruction: Secondary | ICD-10-CM | POA: Diagnosis not present

## 2020-12-11 DIAGNOSIS — K7689 Other specified diseases of liver: Secondary | ICD-10-CM | POA: Diagnosis not present

## 2020-12-11 DIAGNOSIS — R1011 Right upper quadrant pain: Secondary | ICD-10-CM | POA: Diagnosis not present

## 2020-12-11 DIAGNOSIS — Z5321 Procedure and treatment not carried out due to patient leaving prior to being seen by health care provider: Secondary | ICD-10-CM | POA: Insufficient documentation

## 2020-12-11 DIAGNOSIS — R9431 Abnormal electrocardiogram [ECG] [EKG]: Secondary | ICD-10-CM | POA: Diagnosis not present

## 2020-12-11 DIAGNOSIS — Z9861 Coronary angioplasty status: Secondary | ICD-10-CM | POA: Diagnosis not present

## 2020-12-11 DIAGNOSIS — Z85828 Personal history of other malignant neoplasm of skin: Secondary | ICD-10-CM | POA: Diagnosis not present

## 2020-12-11 DIAGNOSIS — E871 Hypo-osmolality and hyponatremia: Secondary | ICD-10-CM | POA: Diagnosis present

## 2020-12-11 DIAGNOSIS — K8066 Calculus of gallbladder and bile duct with acute and chronic cholecystitis without obstruction: Secondary | ICD-10-CM | POA: Diagnosis present

## 2020-12-11 DIAGNOSIS — I493 Ventricular premature depolarization: Secondary | ICD-10-CM | POA: Diagnosis present

## 2020-12-11 DIAGNOSIS — B961 Klebsiella pneumoniae [K. pneumoniae] as the cause of diseases classified elsewhere: Secondary | ICD-10-CM | POA: Diagnosis present

## 2020-12-11 DIAGNOSIS — N3289 Other specified disorders of bladder: Secondary | ICD-10-CM | POA: Diagnosis not present

## 2020-12-11 DIAGNOSIS — I471 Supraventricular tachycardia: Secondary | ICD-10-CM | POA: Diagnosis present

## 2020-12-11 DIAGNOSIS — Z48815 Encounter for surgical aftercare following surgery on the digestive system: Secondary | ICD-10-CM | POA: Diagnosis not present

## 2020-12-11 DIAGNOSIS — E039 Hypothyroidism, unspecified: Secondary | ICD-10-CM | POA: Diagnosis present

## 2020-12-11 DIAGNOSIS — Z833 Family history of diabetes mellitus: Secondary | ICD-10-CM | POA: Diagnosis not present

## 2020-12-11 DIAGNOSIS — Z0389 Encounter for observation for other suspected diseases and conditions ruled out: Secondary | ICD-10-CM | POA: Diagnosis not present

## 2020-12-11 DIAGNOSIS — R7401 Elevation of levels of liver transaminase levels: Secondary | ICD-10-CM | POA: Diagnosis not present

## 2020-12-11 LAB — URINALYSIS, ROUTINE W REFLEX MICROSCOPIC
Bilirubin Urine: NEGATIVE
Glucose, UA: NEGATIVE mg/dL
Hgb urine dipstick: NEGATIVE
Ketones, ur: NEGATIVE mg/dL
Leukocytes,Ua: NEGATIVE
Nitrite: NEGATIVE
Protein, ur: NEGATIVE mg/dL
Specific Gravity, Urine: 1.01 (ref 1.005–1.030)
pH: 7 (ref 5.0–8.0)

## 2020-12-11 LAB — PROTIME-INR
INR: 1.1 (ref 0.8–1.2)
Prothrombin Time: 14.6 seconds (ref 11.4–15.2)

## 2020-12-11 LAB — CBC WITH DIFFERENTIAL/PLATELET
Abs Immature Granulocytes: 0.03 10*3/uL (ref 0.00–0.07)
Basophils Absolute: 0 10*3/uL (ref 0.0–0.1)
Basophils Relative: 0 %
Eosinophils Absolute: 0 10*3/uL (ref 0.0–0.5)
Eosinophils Relative: 0 %
HCT: 41.5 % (ref 39.0–52.0)
Hemoglobin: 14.1 g/dL (ref 13.0–17.0)
Immature Granulocytes: 0 %
Lymphocytes Relative: 3 %
Lymphs Abs: 0.2 10*3/uL — ABNORMAL LOW (ref 0.7–4.0)
MCH: 32 pg (ref 26.0–34.0)
MCHC: 34 g/dL (ref 30.0–36.0)
MCV: 94.3 fL (ref 80.0–100.0)
Monocytes Absolute: 0.1 10*3/uL (ref 0.1–1.0)
Monocytes Relative: 1 %
Neutro Abs: 7.7 10*3/uL (ref 1.7–7.7)
Neutrophils Relative %: 96 %
Platelets: 167 10*3/uL (ref 150–400)
RBC: 4.4 MIL/uL (ref 4.22–5.81)
RDW: 11.9 % (ref 11.5–15.5)
WBC: 8.1 10*3/uL (ref 4.0–10.5)
nRBC: 0 % (ref 0.0–0.2)

## 2020-12-11 LAB — APTT: aPTT: 26 seconds (ref 24–36)

## 2020-12-11 MED ORDER — ACETAMINOPHEN 325 MG PO TABS
650.0000 mg | ORAL_TABLET | Freq: Four times a day (QID) | ORAL | Status: DC | PRN
  Administered 2020-12-11: 650 mg via ORAL
  Filled 2020-12-11: qty 2

## 2020-12-11 NOTE — ED Triage Notes (Signed)
Pt bib from home by family with tremors that began today at 8:30pm. No hx of shaking; pt states he went to lay down and felt bad and "my stomach was twisting". Pt CNS intact. No other complaints.

## 2020-12-11 NOTE — ED Provider Notes (Signed)
Emergency Medicine Provider Triage Evaluation Note  KAYEN GRABEL , a 80 y.o. male  was evaluated in triage.  Pt complains of rigors.  States symptoms started today.  Reports feeling weak.  Denies numbness, tingling, loss of vision, or slurred speech.  Denies any treatment PTA.  Noted to be febrile in triage..  Review of Systems  As per HPI  Physical Exam  BP 115/67 (BP Location: Right Arm)   Pulse (!) 114   Temp (!) 100.6 F (38.1 C) (Oral)   Resp 18   SpO2 96%  Gen:   Awake, no distress   Resp:  Normal effort  MSK:   Moves extremities without difficulty  Other:  Tremulous, tachycardic  Medical Decision Making  Medically screening exam initiated at 10:39 PM.  Appropriate orders placed.  DIJUAN SLEETH was informed that the remainder of the evaluation will be completed by another provider, this initial triage assessment does not replace that evaluation, and the importance of remaining in the ED until their evaluation is complete.  ?COVID   Montine Circle, PA-C 12/11/20 2241    Elnora Morrison, MD 12/11/20 2308

## 2020-12-12 ENCOUNTER — Emergency Department (HOSPITAL_BASED_OUTPATIENT_CLINIC_OR_DEPARTMENT_OTHER): Payer: Medicare HMO

## 2020-12-12 ENCOUNTER — Inpatient Hospital Stay (HOSPITAL_BASED_OUTPATIENT_CLINIC_OR_DEPARTMENT_OTHER)
Admission: EM | Admit: 2020-12-12 | Discharge: 2020-12-15 | DRG: 854 | Disposition: A | Discharge status: Home / Self Care | Payer: Medicare HMO | Attending: Family Medicine | Admitting: Family Medicine

## 2020-12-12 ENCOUNTER — Other Ambulatory Visit: Payer: Self-pay

## 2020-12-12 ENCOUNTER — Inpatient Hospital Stay (HOSPITAL_COMMUNITY): Payer: Medicare HMO

## 2020-12-12 ENCOUNTER — Encounter (HOSPITAL_BASED_OUTPATIENT_CLINIC_OR_DEPARTMENT_OTHER): Payer: Self-pay | Admitting: Emergency Medicine

## 2020-12-12 DIAGNOSIS — I10 Essential (primary) hypertension: Secondary | ICD-10-CM | POA: Diagnosis present

## 2020-12-12 DIAGNOSIS — Z8249 Family history of ischemic heart disease and other diseases of the circulatory system: Secondary | ICD-10-CM | POA: Diagnosis not present

## 2020-12-12 DIAGNOSIS — Z8546 Personal history of malignant neoplasm of prostate: Secondary | ICD-10-CM

## 2020-12-12 DIAGNOSIS — Z806 Family history of leukemia: Secondary | ICD-10-CM | POA: Diagnosis not present

## 2020-12-12 DIAGNOSIS — E039 Hypothyroidism, unspecified: Secondary | ICD-10-CM | POA: Diagnosis not present

## 2020-12-12 DIAGNOSIS — F419 Anxiety disorder, unspecified: Secondary | ICD-10-CM | POA: Diagnosis present

## 2020-12-12 DIAGNOSIS — Z79899 Other long term (current) drug therapy: Secondary | ICD-10-CM

## 2020-12-12 DIAGNOSIS — A4159 Other Gram-negative sepsis: Principal | ICD-10-CM | POA: Diagnosis present

## 2020-12-12 DIAGNOSIS — G4700 Insomnia, unspecified: Secondary | ICD-10-CM | POA: Diagnosis present

## 2020-12-12 DIAGNOSIS — R7881 Bacteremia: Secondary | ICD-10-CM | POA: Insufficient documentation

## 2020-12-12 DIAGNOSIS — I493 Ventricular premature depolarization: Secondary | ICD-10-CM | POA: Diagnosis present

## 2020-12-12 DIAGNOSIS — Z20822 Contact with and (suspected) exposure to covid-19: Secondary | ICD-10-CM | POA: Diagnosis present

## 2020-12-12 DIAGNOSIS — R6520 Severe sepsis without septic shock: Secondary | ICD-10-CM | POA: Diagnosis not present

## 2020-12-12 DIAGNOSIS — R1011 Right upper quadrant pain: Secondary | ICD-10-CM

## 2020-12-12 DIAGNOSIS — I2510 Atherosclerotic heart disease of native coronary artery without angina pectoris: Secondary | ICD-10-CM | POA: Diagnosis present

## 2020-12-12 DIAGNOSIS — K810 Acute cholecystitis: Secondary | ICD-10-CM

## 2020-12-12 DIAGNOSIS — Z85828 Personal history of other malignant neoplasm of skin: Secondary | ICD-10-CM

## 2020-12-12 DIAGNOSIS — Z419 Encounter for procedure for purposes other than remedying health state, unspecified: Secondary | ICD-10-CM

## 2020-12-12 DIAGNOSIS — I471 Supraventricular tachycardia: Secondary | ICD-10-CM | POA: Diagnosis present

## 2020-12-12 DIAGNOSIS — Z9861 Coronary angioplasty status: Secondary | ICD-10-CM

## 2020-12-12 DIAGNOSIS — Z955 Presence of coronary angioplasty implant and graft: Secondary | ICD-10-CM

## 2020-12-12 DIAGNOSIS — A419 Sepsis, unspecified organism: Secondary | ICD-10-CM | POA: Diagnosis present

## 2020-12-12 DIAGNOSIS — Z79890 Hormone replacement therapy (postmenopausal): Secondary | ICD-10-CM

## 2020-12-12 DIAGNOSIS — K8066 Calculus of gallbladder and bile duct with acute and chronic cholecystitis without obstruction: Secondary | ICD-10-CM | POA: Diagnosis not present

## 2020-12-12 DIAGNOSIS — K8032 Calculus of bile duct with acute cholangitis without obstruction: Secondary | ICD-10-CM | POA: Diagnosis not present

## 2020-12-12 DIAGNOSIS — K759 Inflammatory liver disease, unspecified: Secondary | ICD-10-CM

## 2020-12-12 DIAGNOSIS — Z970 Presence of artificial eye: Secondary | ICD-10-CM

## 2020-12-12 DIAGNOSIS — Z833 Family history of diabetes mellitus: Secondary | ICD-10-CM

## 2020-12-12 DIAGNOSIS — E871 Hypo-osmolality and hyponatremia: Secondary | ICD-10-CM | POA: Diagnosis present

## 2020-12-12 DIAGNOSIS — B961 Klebsiella pneumoniae [K. pneumoniae] as the cause of diseases classified elsewhere: Secondary | ICD-10-CM | POA: Diagnosis present

## 2020-12-12 DIAGNOSIS — K219 Gastro-esophageal reflux disease without esophagitis: Secondary | ICD-10-CM | POA: Diagnosis present

## 2020-12-12 DIAGNOSIS — N8110 Cystocele, unspecified: Secondary | ICD-10-CM

## 2020-12-12 DIAGNOSIS — Z6821 Body mass index (BMI) 21.0-21.9, adult: Secondary | ICD-10-CM

## 2020-12-12 DIAGNOSIS — Z7982 Long term (current) use of aspirin: Secondary | ICD-10-CM

## 2020-12-12 DIAGNOSIS — E278 Other specified disorders of adrenal gland: Secondary | ICD-10-CM

## 2020-12-12 DIAGNOSIS — R634 Abnormal weight loss: Secondary | ICD-10-CM | POA: Diagnosis not present

## 2020-12-12 DIAGNOSIS — K8020 Calculus of gallbladder without cholecystitis without obstruction: Secondary | ICD-10-CM

## 2020-12-12 DIAGNOSIS — K8309 Other cholangitis: Secondary | ICD-10-CM

## 2020-12-12 DIAGNOSIS — R7989 Other specified abnormal findings of blood chemistry: Secondary | ICD-10-CM

## 2020-12-12 DIAGNOSIS — R6510 Systemic inflammatory response syndrome (SIRS) of non-infectious origin without acute organ dysfunction: Secondary | ICD-10-CM

## 2020-12-12 LAB — COMPREHENSIVE METABOLIC PANEL
ALT: 225 U/L — ABNORMAL HIGH (ref 0–44)
ALT: 476 U/L — ABNORMAL HIGH (ref 0–44)
AST: 396 U/L — ABNORMAL HIGH (ref 15–41)
AST: 375 U/L — ABNORMAL HIGH (ref 15–41)
Albumin: 3.5 g/dL (ref 3.5–5.0)
Albumin: 3.7 g/dL (ref 3.5–5.0)
Alkaline Phosphatase: 111 U/L (ref 38–126)
Alkaline Phosphatase: 98 U/L (ref 38–126)
Anion gap: 10 (ref 5–15)
Anion gap: 5 (ref 5–15)
BUN: 14 mg/dL (ref 8–23)
BUN: 17 mg/dL (ref 8–23)
CO2: 23 mmol/L (ref 22–32)
CO2: 28 mmol/L (ref 22–32)
Calcium: 8.9 mg/dL (ref 8.9–10.3)
Calcium: 8.8 mg/dL — ABNORMAL LOW (ref 8.9–10.3)
Chloride: 101 mmol/L (ref 98–111)
Chloride: 103 mmol/L (ref 98–111)
Creatinine, Ser: 0.86 mg/dL (ref 0.61–1.24)
Creatinine, Ser: 0.8 mg/dL (ref 0.61–1.24)
GFR, Estimated: >60 mL/min (ref >60)
GFR, Estimated: >60 mL/min (ref >60)
Glucose, Bld: 137 mg/dL — ABNORMAL HIGH (ref 70–99)
Glucose, Bld: 115 mg/dL — ABNORMAL HIGH (ref 70–99)
Potassium: 3.9 mmol/L (ref 3.5–5.1)
Potassium: 4.3 mmol/L (ref 3.5–5.1)
Sodium: 134 mmol/L — ABNORMAL LOW (ref 135–145)
Sodium: 136 mmol/L (ref 135–145)
Total Bilirubin: 2.3 mg/dL — ABNORMAL HIGH (ref 0.3–1.2)
Total Bilirubin: 5.1 mg/dL — ABNORMAL HIGH (ref 0.3–1.2)
Total Protein: 6.5 g/dL (ref 6.5–8.1)
Total Protein: 6.3 g/dL — ABNORMAL LOW (ref 6.5–8.1)

## 2020-12-12 LAB — PHOSPHORUS: Phosphorus: 3 mg/dL (ref 2.5–4.6)

## 2020-12-12 LAB — CBC
HCT: 38.4 % — ABNORMAL LOW (ref 39.0–52.0)
Hemoglobin: 13.2 g/dL (ref 13.0–17.0)
MCH: 32.4 pg (ref 26.0–34.0)
MCHC: 34.4 g/dL (ref 30.0–36.0)
MCV: 94.1 fL (ref 80.0–100.0)
Platelets: 131 10*3/uL — ABNORMAL LOW (ref 150–400)
RBC: 4.08 MIL/uL — ABNORMAL LOW (ref 4.22–5.81)
RDW: 12.1 % (ref 11.5–15.5)
WBC: 14.8 10*3/uL — ABNORMAL HIGH (ref 4.0–10.5)
nRBC: 0 % (ref 0.0–0.2)

## 2020-12-12 LAB — LACTIC ACID, PLASMA
Lactic Acid, Venous: 3.1 mmol/L (ref 0.5–1.9)
Lactic Acid, Venous: 1.6 mmol/L (ref 0.5–1.9)
Lactic Acid, Venous: 1.5 mmol/L (ref 0.5–1.9)

## 2020-12-12 LAB — MAGNESIUM: Magnesium: 2.3 mg/dL (ref 1.7–2.4)

## 2020-12-12 LAB — TROPONIN I (HIGH SENSITIVITY): Troponin I (High Sensitivity): 9 ng/L (ref <18)

## 2020-12-12 LAB — RESP PANEL BY RT-PCR (FLU A&B, COVID) ARPGX2
Influenza A by PCR: NEGATIVE
Influenza B by PCR: NEGATIVE
SARS Coronavirus 2 by RT PCR: NEGATIVE

## 2020-12-12 LAB — HEPATITIS PANEL, ACUTE
HCV Ab: NONREACTIVE
Hep A IgM: NONREACTIVE
Hep B C IgM: NONREACTIVE
Hepatitis B Surface Ag: NONREACTIVE

## 2020-12-12 LAB — HIV ANTIBODY (ROUTINE TESTING W REFLEX): HIV Screen 4th Generation wRfx: NONREACTIVE

## 2020-12-12 LAB — TSH: TSH: 0.191 u[IU]/mL — ABNORMAL LOW (ref 0.350–4.500)

## 2020-12-12 MED ORDER — SODIUM CHLORIDE 0.9 % IV SOLN
2.0000 g | Freq: Three times a day (TID) | INTRAVENOUS | Status: DC
  Administered 2020-12-12 (×2): 2 g via INTRAVENOUS
  Filled 2020-12-12 (×3): qty 2

## 2020-12-12 MED ORDER — NITROGLYCERIN 0.4 MG SL SUBL: 0.4000 mg | SUBLINGUAL_TABLET | SUBLINGUAL | Status: DC | PRN

## 2020-12-12 MED ORDER — SODIUM CHLORIDE 0.9 % IV SOLN
2.0000 g | INTRAVENOUS | Status: AC
  Administered 2020-12-12: 2 g via INTRAVENOUS
  Filled 2020-12-12: qty 2

## 2020-12-12 MED ORDER — ONDANSETRON HCL 4 MG PO TABS: 4.0000 mg | ORAL_TABLET | Freq: Four times a day (QID) | ORAL | Status: DC | PRN

## 2020-12-12 MED ORDER — ASPIRIN EC 81 MG PO TBEC
81.0000 mg | DELAYED_RELEASE_TABLET | Freq: Every day | ORAL | Status: DC
  Administered 2020-12-12 – 2020-12-15 (×3): 81 mg via ORAL
  Filled 2020-12-12 (×3): qty 1

## 2020-12-12 MED ORDER — ENOXAPARIN SODIUM 40 MG/0.4ML IJ SOSY
40.0000 mg | PREFILLED_SYRINGE | INTRAMUSCULAR | Status: DC
  Administered 2020-12-12: 40 mg via SUBCUTANEOUS
  Filled 2020-12-12: qty 0.4

## 2020-12-12 MED ORDER — ONDANSETRON HCL 4 MG/2ML IJ SOLN
4.0000 mg | Freq: Four times a day (QID) | INTRAMUSCULAR | Status: DC | PRN
  Administered 2020-12-13 (×2): 4 mg via INTRAVENOUS

## 2020-12-12 MED ORDER — METRONIDAZOLE 500 MG/100ML IV SOLN
500.0000 mg | Freq: Three times a day (TID) | INTRAVENOUS | Status: DC
  Administered 2020-12-12 – 2020-12-13 (×4): 500 mg via INTRAVENOUS
  Filled 2020-12-12 (×4): qty 100

## 2020-12-12 MED ORDER — VANCOMYCIN HCL IN DEXTROSE 1-5 GM/200ML-% IV SOLN
1000.0000 mg | Freq: Once | INTRAVENOUS | Status: AC
  Administered 2020-12-12: 1000 mg via INTRAVENOUS
  Filled 2020-12-12: qty 200

## 2020-12-12 MED ORDER — PANTOPRAZOLE SODIUM 40 MG IV SOLR
40.0000 mg | Freq: Two times a day (BID) | INTRAVENOUS | Status: DC
  Administered 2020-12-12 – 2020-12-15 (×5): 40 mg via INTRAVENOUS
  Filled 2020-12-12 (×5): qty 40

## 2020-12-12 MED ORDER — CLONAZEPAM 0.125 MG PO TBDP
0.2500 mg | ORAL_TABLET | Freq: Every evening | ORAL | Status: DC | PRN
  Administered 2020-12-12 – 2020-12-13 (×2): 0.25 mg via ORAL
  Filled 2020-12-12 (×2): qty 2

## 2020-12-12 MED ORDER — SODIUM CHLORIDE 0.9 % IV BOLUS
500.0000 mL | Freq: Once | INTRAVENOUS | Status: AC
  Administered 2020-12-12: 500 mL via INTRAVENOUS

## 2020-12-12 MED ORDER — VANCOMYCIN HCL 750 MG/150ML IV SOLN
750.0000 mg | Freq: Once | INTRAVENOUS | Status: AC
  Administered 2020-12-12: 750 mg via INTRAVENOUS
  Filled 2020-12-12: qty 150

## 2020-12-12 MED ORDER — ACETAMINOPHEN 325 MG PO TABS
650.0000 mg | ORAL_TABLET | Freq: Four times a day (QID) | ORAL | Status: DC | PRN
  Administered 2020-12-13: 650 mg via ORAL
  Filled 2020-12-12: qty 2

## 2020-12-12 MED ORDER — ACETAMINOPHEN 650 MG RE SUPP: 650.0000 mg | Freq: Four times a day (QID) | RECTAL | Status: DC | PRN

## 2020-12-12 MED ORDER — SODIUM CHLORIDE 0.9 % IV SOLN: INTRAVENOUS | Status: DC

## 2020-12-12 MED ORDER — LEVOTHYROXINE SODIUM 75 MCG PO TABS
150.0000 ug | ORAL_TABLET | Freq: Every day | ORAL | Status: DC
  Administered 2020-12-12 – 2020-12-15 (×3): 150 ug via ORAL
  Filled 2020-12-12 (×3): qty 2

## 2020-12-12 MED ORDER — VANCOMYCIN HCL 1750 MG/350ML IV SOLN: 1750.0000 mg | INTRAVENOUS | Status: DC

## 2020-12-12 NOTE — ED Triage Notes (Signed)
Pt brought in by family  Pt came here from [place] he had a work up over there but did not stay due to long wait times  Pt states tonight when he laid down he started shaking all over and his heart was fluttering  Pt states he took mylanta, half of a nerve pill, and a blood pressure pill   Pt family states he has lost about 30 lbs in a month to month and a half  Pt has an appointment with a GI specialist on July 13th  Pt states he is feeling weak now  Family states his balance is off lately

## 2020-12-12 NOTE — ED Notes (Signed)
Pt called 3x no answer  
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
## 2020-12-12 NOTE — Progress Notes (Addendum)
Pharmacy Antibiotic Note  Herbert Carr is a 80 y.o. male admitted on 12/12/2020 with sepsis.  Pharmacy has been consulted for vancomycin and cefepime dosing.  Plan: - Vancomycin 1750 IV every 24 hours, estimate AUC 515 using SCr 0.86 - Plan to check levels at steady state if tx continues, Goal AUC 400 - 550  - Cefepime 2 GM IV every 8 hours  Height: 6' (182.9 cm) Weight: 71.7 kg (158 lb) IBW/kg (Calculated) : 77.6  Temp (24hrs), Avg:99.6 F (37.6 C), Min:98.8 F (37.1 C), Max:100.6 F (38.1 C)  Recent Labs  Lab 12/11/20 2300 12/11/20 2308  WBC  --  8.1  CREATININE  --  0.86  LATICACIDVEN 3.1*  --     Estimated Creatinine Clearance: 68.3 mL/min (by C-G formula based on SCr of 0.86 mg/dL).    No Known Allergies  Antimicrobials this admission: 06/21 Vancomycin >>  06/21 Cefepime >>   Microbiology results: 06/20 BCx: Pending   Thank you for allowing pharmacy to be a part of this patient's care.  Darcel Smalling, Student Pharmacist 12/12/2020 9:59 AM
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
## 2020-12-12 NOTE — ED Notes (Signed)
Report given to [facility] 
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
## 2020-12-13 ENCOUNTER — Encounter (HOSPITAL_COMMUNITY)
Admission: EM | Disposition: A | Discharge status: Home / Self Care | Payer: Self-pay | Source: Home / Self Care | Attending: Family Medicine

## 2020-12-13 ENCOUNTER — Inpatient Hospital Stay (HOSPITAL_COMMUNITY): Payer: Medicare HMO | Admitting: Anesthesiology

## 2020-12-13 ENCOUNTER — Inpatient Hospital Stay (HOSPITAL_COMMUNITY): Payer: Medicare HMO | Admitting: Certified Registered Nurse Anesthetist

## 2020-12-13 ENCOUNTER — Encounter (HOSPITAL_COMMUNITY): Payer: Self-pay | Admitting: Internal Medicine

## 2020-12-13 ENCOUNTER — Inpatient Hospital Stay (HOSPITAL_COMMUNITY): Payer: Medicare HMO

## 2020-12-13 DIAGNOSIS — R634 Abnormal weight loss: Secondary | ICD-10-CM

## 2020-12-13 DIAGNOSIS — K810 Acute cholecystitis: Secondary | ICD-10-CM

## 2020-12-13 DIAGNOSIS — R6520 Severe sepsis without septic shock: Secondary | ICD-10-CM

## 2020-12-13 DIAGNOSIS — K8309 Other cholangitis: Secondary | ICD-10-CM

## 2020-12-13 DIAGNOSIS — A419 Sepsis, unspecified organism: Secondary | ICD-10-CM

## 2020-12-13 DIAGNOSIS — R7881 Bacteremia: Secondary | ICD-10-CM | POA: Insufficient documentation

## 2020-12-13 HISTORY — PX: ERCP: SHX5425

## 2020-12-13 HISTORY — PX: SPHINCTEROTOMY: SHX5544

## 2020-12-13 HISTORY — PX: CHOLECYSTECTOMY: SHX55

## 2020-12-13 LAB — BLOOD CULTURE ID PANEL (REFLEXED) - BCID2
A.calcoaceticus-baumannii: NOT DETECTED
A.calcoaceticus-baumannii: NOT DETECTED
Bacteroides fragilis: NOT DETECTED
Bacteroides fragilis: NOT DETECTED
CTX-M ESBL: NOT DETECTED
CTX-M ESBL: NOT DETECTED
Candida albicans: NOT DETECTED
Candida albicans: NOT DETECTED
Candida auris: NOT DETECTED
Candida auris: NOT DETECTED
Candida glabrata: NOT DETECTED
Candida glabrata: NOT DETECTED
Candida krusei: NOT DETECTED
Candida krusei: NOT DETECTED
Candida parapsilosis: NOT DETECTED
Candida parapsilosis: NOT DETECTED
Candida tropicalis: NOT DETECTED
Candida tropicalis: NOT DETECTED
Carbapenem resist OXA 48 LIKE: NOT DETECTED
Carbapenem resist OXA 48 LIKE: NOT DETECTED
Carbapenem resistance IMP: NOT DETECTED
Carbapenem resistance IMP: NOT DETECTED
Carbapenem resistance KPC: NOT DETECTED
Carbapenem resistance KPC: NOT DETECTED
Carbapenem resistance NDM: NOT DETECTED
Carbapenem resistance NDM: NOT DETECTED
Carbapenem resistance VIM: NOT DETECTED
Carbapenem resistance VIM: NOT DETECTED
Cryptococcus neoformans/gattii: NOT DETECTED
Cryptococcus neoformans/gattii: NOT DETECTED
Enterobacter cloacae complex: NOT DETECTED
Enterobacter cloacae complex: NOT DETECTED
Enterobacterales: DETECTED — AB
Enterobacterales: DETECTED — AB
Enterococcus Faecium: NOT DETECTED
Enterococcus Faecium: NOT DETECTED
Enterococcus faecalis: NOT DETECTED
Enterococcus faecalis: NOT DETECTED
Escherichia coli: NOT DETECTED
Escherichia coli: NOT DETECTED
Haemophilus influenzae: NOT DETECTED
Haemophilus influenzae: NOT DETECTED
Klebsiella aerogenes: NOT DETECTED
Klebsiella aerogenes: NOT DETECTED
Klebsiella oxytoca: NOT DETECTED
Klebsiella oxytoca: NOT DETECTED
Klebsiella pneumoniae: DETECTED — AB
Klebsiella pneumoniae: DETECTED — AB
Listeria monocytogenes: NOT DETECTED
Listeria monocytogenes: NOT DETECTED
Neisseria meningitidis: NOT DETECTED
Neisseria meningitidis: NOT DETECTED
Proteus species: NOT DETECTED
Proteus species: NOT DETECTED
Pseudomonas aeruginosa: NOT DETECTED
Pseudomonas aeruginosa: NOT DETECTED
Salmonella species: NOT DETECTED
Salmonella species: NOT DETECTED
Serratia marcescens: NOT DETECTED
Serratia marcescens: NOT DETECTED
Staphylococcus aureus (BCID): NOT DETECTED
Staphylococcus aureus (BCID): NOT DETECTED
Staphylococcus epidermidis: NOT DETECTED
Staphylococcus epidermidis: NOT DETECTED
Staphylococcus lugdunensis: NOT DETECTED
Staphylococcus lugdunensis: NOT DETECTED
Staphylococcus species: NOT DETECTED
Staphylococcus species: NOT DETECTED
Stenotrophomonas maltophilia: NOT DETECTED
Stenotrophomonas maltophilia: NOT DETECTED
Streptococcus agalactiae: NOT DETECTED
Streptococcus agalactiae: NOT DETECTED
Streptococcus pneumoniae: NOT DETECTED
Streptococcus pneumoniae: NOT DETECTED
Streptococcus pyogenes: NOT DETECTED
Streptococcus pyogenes: NOT DETECTED
Streptococcus species: NOT DETECTED
Streptococcus species: NOT DETECTED

## 2020-12-13 LAB — COMPREHENSIVE METABOLIC PANEL
ALT: 254 U/L — ABNORMAL HIGH (ref 0–44)
AST: 151 U/L — ABNORMAL HIGH (ref 15–41)
Albumin: 3 g/dL — ABNORMAL LOW (ref 3.5–5.0)
Alkaline Phosphatase: 71 U/L (ref 38–126)
Anion gap: 7 (ref 5–15)
BUN: 11 mg/dL (ref 8–23)
CO2: 23 mmol/L (ref 22–32)
Calcium: 8 mg/dL — ABNORMAL LOW (ref 8.9–10.3)
Chloride: 104 mmol/L (ref 98–111)
Creatinine, Ser: 0.41 mg/dL — ABNORMAL LOW (ref 0.61–1.24)
GFR, Estimated: >60 mL/min (ref >60)
Glucose, Bld: 96 mg/dL (ref 70–99)
Potassium: 4.7 mmol/L (ref 3.5–5.1)
Sodium: 134 mmol/L — ABNORMAL LOW (ref 135–145)
Total Bilirubin: 6.3 mg/dL — ABNORMAL HIGH (ref 0.3–1.2)
Total Protein: 5.1 g/dL — ABNORMAL LOW (ref 6.5–8.1)

## 2020-12-13 LAB — CBC
HCT: 34.5 % — ABNORMAL LOW (ref 39.0–52.0)
Hemoglobin: 11.6 g/dL — ABNORMAL LOW (ref 13.0–17.0)
MCH: 32 pg (ref 26.0–34.0)
MCHC: 33.6 g/dL (ref 30.0–36.0)
MCV: 95 fL (ref 80.0–100.0)
Platelets: 95 10*3/uL — ABNORMAL LOW (ref 150–400)
RBC: 3.63 MIL/uL — ABNORMAL LOW (ref 4.22–5.81)
RDW: 12.1 % (ref 11.5–15.5)
WBC: 7.3 10*3/uL (ref 4.0–10.5)
nRBC: 0 % (ref 0.0–0.2)

## 2020-12-13 LAB — PROCALCITONIN: Procalcitonin: 18.3 ng/mL

## 2020-12-13 SURGERY — ERCP, WITH INTERVENTION IF INDICATED
Anesthesia: General

## 2020-12-13 SURGERY — LAPAROSCOPIC CHOLECYSTECTOMY
Anesthesia: General

## 2020-12-13 MED ORDER — FENTANYL CITRATE (PF) 100 MCG/2ML IJ SOLN
INTRAMUSCULAR | Status: AC
  Filled 2020-12-13: qty 2

## 2020-12-13 MED ORDER — SODIUM CHLORIDE 0.9 % IV SOLN
INTRAVENOUS | Status: DC | PRN
  Administered 2020-12-13: 25 mL

## 2020-12-13 MED ORDER — ACETAMINOPHEN 10 MG/ML IV SOLN: 1000.0000 mg | Freq: Once | INTRAVENOUS | Status: DC | PRN

## 2020-12-13 MED ORDER — FENTANYL CITRATE (PF) 100 MCG/2ML IJ SOLN
INTRAMUSCULAR | Status: DC | PRN
  Administered 2020-12-13: 100 ug via INTRAVENOUS

## 2020-12-13 MED ORDER — FENTANYL CITRATE (PF) 100 MCG/2ML IJ SOLN: 25.0000 ug | INTRAMUSCULAR | Status: DC | PRN

## 2020-12-13 MED ORDER — PROPOFOL 10 MG/ML IV BOLUS
INTRAVENOUS | Status: DC | PRN
  Administered 2020-12-13: 110 mg via INTRAVENOUS

## 2020-12-13 MED ORDER — ROCURONIUM BROMIDE 10 MG/ML (PF) SYRINGE
PREFILLED_SYRINGE | INTRAVENOUS | Status: AC
  Filled 2020-12-13: qty 10

## 2020-12-13 MED ORDER — LIDOCAINE 2% (20 MG/ML) 5 ML SYRINGE
INTRAMUSCULAR | Status: DC | PRN
  Administered 2020-12-13: 60 mg via INTRAVENOUS

## 2020-12-13 MED ORDER — DEXAMETHASONE SODIUM PHOSPHATE 10 MG/ML IJ SOLN
INTRAMUSCULAR | Status: AC
  Filled 2020-12-13: qty 1

## 2020-12-13 MED ORDER — PROPOFOL 10 MG/ML IV BOLUS
INTRAVENOUS | Status: AC
  Filled 2020-12-13: qty 40

## 2020-12-13 MED ORDER — LIDOCAINE 2% (20 MG/ML) 5 ML SYRINGE
INTRAMUSCULAR | Status: AC
  Filled 2020-12-13: qty 5

## 2020-12-13 MED ORDER — GLUCAGON HCL RDNA (DIAGNOSTIC) 1 MG IJ SOLR
INTRAMUSCULAR | Status: AC
  Filled 2020-12-13: qty 1

## 2020-12-13 MED ORDER — POLYETHYLENE GLYCOL 3350 17 G PO PACK
17.0000 g | PACK | Freq: Every day | ORAL | Status: DC
  Administered 2020-12-14 – 2020-12-15 (×2): 17 g via ORAL
  Filled 2020-12-13 (×3): qty 1

## 2020-12-13 MED ORDER — LACTATED RINGERS IR SOLN
Status: DC | PRN
  Administered 2020-12-13: 1000 mL

## 2020-12-13 MED ORDER — DOCUSATE SODIUM 100 MG PO CAPS
100.0000 mg | ORAL_CAPSULE | Freq: Two times a day (BID) | ORAL | Status: DC
  Administered 2020-12-13 – 2020-12-15 (×4): 100 mg via ORAL
  Filled 2020-12-13 (×4): qty 1

## 2020-12-13 MED ORDER — ONDANSETRON HCL 4 MG/2ML IJ SOLN: 4.0000 mg | Freq: Once | INTRAMUSCULAR | Status: DC | PRN

## 2020-12-13 MED ORDER — ROCURONIUM BROMIDE 10 MG/ML (PF) SYRINGE
PREFILLED_SYRINGE | INTRAVENOUS | Status: DC | PRN
  Administered 2020-12-13: 50 mg via INTRAVENOUS

## 2020-12-13 MED ORDER — PROPOFOL 10 MG/ML IV BOLUS
INTRAVENOUS | Status: AC
  Filled 2020-12-13: qty 20

## 2020-12-13 MED ORDER — SUGAMMADEX SODIUM 200 MG/2ML IV SOLN
INTRAVENOUS | Status: DC | PRN
  Administered 2020-12-13: 200 mg via INTRAVENOUS

## 2020-12-13 MED ORDER — SODIUM CHLORIDE 0.9 % IV SOLN
2.0000 g | INTRAVENOUS | Status: DC
  Administered 2020-12-13 – 2020-12-15 (×3): 2 g via INTRAVENOUS
  Filled 2020-12-13 (×3): qty 2

## 2020-12-13 MED ORDER — LACTATED RINGERS IV SOLN: INTRAVENOUS | Status: DC

## 2020-12-13 MED ORDER — ROCURONIUM BROMIDE 10 MG/ML (PF) SYRINGE
PREFILLED_SYRINGE | INTRAVENOUS | Status: DC | PRN
  Administered 2020-12-13: 70 mg via INTRAVENOUS

## 2020-12-13 MED ORDER — INDOMETHACIN 50 MG RE SUPP
RECTAL | Status: AC
  Filled 2020-12-13: qty 2

## 2020-12-13 MED ORDER — LIDOCAINE 2% (20 MG/ML) 5 ML SYRINGE
INTRAMUSCULAR | Status: DC | PRN
  Administered 2020-12-13: 100 mg via INTRAVENOUS

## 2020-12-13 MED ORDER — DEXAMETHASONE SODIUM PHOSPHATE 10 MG/ML IJ SOLN
INTRAMUSCULAR | Status: DC | PRN
  Administered 2020-12-13: 4 mg via INTRAVENOUS

## 2020-12-13 MED ORDER — ACETAMINOPHEN 10 MG/ML IV SOLN
INTRAVENOUS | Status: AC
  Administered 2020-12-13: 1000 mg via INTRAVENOUS
  Filled 2020-12-13: qty 100

## 2020-12-13 MED ORDER — MORPHINE SULFATE (PF) 2 MG/ML IV SOLN
2.0000 mg | INTRAVENOUS | Status: DC | PRN
  Administered 2020-12-13: 2 mg via INTRAVENOUS
  Filled 2020-12-13: qty 1

## 2020-12-13 MED ORDER — LACTATED RINGERS IV SOLN: INTRAVENOUS | Status: DC | PRN

## 2020-12-13 MED ORDER — PHENYLEPHRINE 40 MCG/ML (10ML) SYRINGE FOR IV PUSH (FOR BLOOD PRESSURE SUPPORT)
PREFILLED_SYRINGE | INTRAVENOUS | Status: DC | PRN
  Administered 2020-12-13: 80 ug via INTRAVENOUS

## 2020-12-13 MED ORDER — PHENYLEPHRINE HCL-NACL 10-0.9 MG/250ML-% IV SOLN
INTRAVENOUS | Status: DC | PRN
  Administered 2020-12-13: 20 ug/min via INTRAVENOUS

## 2020-12-13 MED ORDER — BUPIVACAINE-EPINEPHRINE 0.25% -1:200000 IJ SOLN
INTRAMUSCULAR | Status: DC | PRN
  Administered 2020-12-13: 30 mL

## 2020-12-13 MED ORDER — SODIUM CHLORIDE 0.9 % IV SOLN: INTRAVENOUS | Status: DC

## 2020-12-13 MED ORDER — ONDANSETRON HCL 4 MG/2ML IJ SOLN
INTRAMUSCULAR | Status: AC
  Filled 2020-12-13: qty 2

## 2020-12-13 MED ORDER — FENTANYL CITRATE (PF) 100 MCG/2ML IJ SOLN
INTRAMUSCULAR | Status: DC | PRN
  Administered 2020-12-13: 100 ug via INTRAVENOUS
  Administered 2020-12-13: 50 ug via INTRAVENOUS
  Administered 2020-12-13 (×2): 25 ug via INTRAVENOUS

## 2020-12-13 MED ORDER — SUGAMMADEX SODIUM 200 MG/2ML IV SOLN
INTRAVENOUS | Status: DC | PRN
  Administered 2020-12-13: 300 mg via INTRAVENOUS

## 2020-12-13 MED ORDER — OXYCODONE HCL 5 MG PO TABS: 5.0000 mg | ORAL_TABLET | ORAL | Status: DC | PRN

## 2020-12-13 MED ORDER — PHENYLEPHRINE HCL (PRESSORS) 10 MG/ML IV SOLN
INTRAVENOUS | Status: AC
  Filled 2020-12-13: qty 1

## 2020-12-13 MED ORDER — PROPOFOL 10 MG/ML IV BOLUS
INTRAVENOUS | Status: DC | PRN
  Administered 2020-12-13: 120 mg via INTRAVENOUS

## 2020-12-13 MED ORDER — BUPIVACAINE-EPINEPHRINE (PF) 0.25% -1:200000 IJ SOLN
INTRAMUSCULAR | Status: AC
  Filled 2020-12-13: qty 30

## 2020-12-13 SURGICAL SUPPLY — 59 items
ADH SKN CLS APL DERMABOND .7 (GAUZE/BANDAGES/DRESSINGS)
APL PRP STRL LF DISP 70% ISPRP (MISCELLANEOUS) ×1
APL SKNCLS STERI-STRIP NONHPOA (GAUZE/BANDAGES/DRESSINGS)
APL SRG 38 LTWT LNG FL B (MISCELLANEOUS)
APPLICATOR ARISTA FLEXITIP XL (MISCELLANEOUS) IMPLANT
APPLIER CLIP 5 13 M/L LIGAMAX5 (MISCELLANEOUS) ×2
APPLIER CLIP ROT 10 11.4 M/L (STAPLE)
APR CLP MED LRG 11.4X10 (STAPLE)
APR CLP MED LRG 5 ANG JAW (MISCELLANEOUS) ×1
BAG COUNTER SPONGE SURGICOUNT (BAG) IMPLANT
BAG SPEC RTRVL 10 TROC 200 (ENDOMECHANICALS) ×1
BAG SPNG CNTER NS LX DISP (BAG)
BENZOIN TINCTURE PRP APPL 2/3 (GAUZE/BANDAGES/DRESSINGS) IMPLANT
BNDG ADH 1X3 SHEER STRL LF (GAUZE/BANDAGES/DRESSINGS) ×8 IMPLANT
BNDG ADH THN 3X1 STRL LF (GAUZE/BANDAGES/DRESSINGS) ×4
CABLE HIGH FREQUENCY MONO STRZ (ELECTRODE) ×2 IMPLANT
CHLORAPREP W/TINT 26 (MISCELLANEOUS) ×2 IMPLANT
CLIP APPLIE 5 13 M/L LIGAMAX5 (MISCELLANEOUS) IMPLANT
CLIP APPLIE ROT 10 11.4 M/L (STAPLE) IMPLANT
CLIP VESOLOCK MED LG 6/CT (CLIP) IMPLANT
COVER MAYO STAND STRL (DRAPES) IMPLANT
COVER SURGICAL LIGHT HANDLE (MISCELLANEOUS) ×2 IMPLANT
DECANTER SPIKE VIAL GLASS SM (MISCELLANEOUS) ×2 IMPLANT
DERMABOND ADVANCED (GAUZE/BANDAGES/DRESSINGS)
DERMABOND ADVANCED .7 DNX12 (GAUZE/BANDAGES/DRESSINGS) IMPLANT
DRAPE C-ARM 42X120 X-RAY (DRAPES) IMPLANT
DRSG TEGADERM 2-3/8X2-3/4 SM (GAUZE/BANDAGES/DRESSINGS) IMPLANT
ELECT REM PT RETURN 15FT ADLT (MISCELLANEOUS) ×2 IMPLANT
ENDOLOOP SUT PDS II  0 18 (SUTURE) ×2
ENDOLOOP SUT PDS II 0 18 (SUTURE) IMPLANT
GAUZE SPONGE 2X2 8PLY STRL LF (GAUZE/BANDAGES/DRESSINGS) IMPLANT
GLOVE SURG POLY ORTHO LF SZ7.5 (GLOVE) ×2 IMPLANT
GOWN STRL REUS W/TWL XL LVL3 (GOWN DISPOSABLE) ×7 IMPLANT
GRASPER SUT TROCAR 14GX15 (MISCELLANEOUS) IMPLANT
HEMOSTAT ARISTA ABSORB 3G PWDR (HEMOSTASIS) IMPLANT
HEMOSTAT SNOW SURGICEL 2X4 (HEMOSTASIS) ×1 IMPLANT
KIT BASIN OR (CUSTOM PROCEDURE TRAY) ×2 IMPLANT
KIT TURNOVER KIT A (KITS) ×2 IMPLANT
L-HOOK LAP DISP 36CM (ELECTROSURGICAL)
LHOOK LAP DISP 36CM (ELECTROSURGICAL) IMPLANT
POUCH RETRIEVAL ECOSAC 10 (ENDOMECHANICALS) ×1 IMPLANT
POUCH RETRIEVAL ECOSAC 10MM (ENDOMECHANICALS) ×2
SCISSORS LAP 5X35 DISP (ENDOMECHANICALS) ×2 IMPLANT
SET CHOLANGIOGRAPH MIX (MISCELLANEOUS) IMPLANT
SET IRRIG TUBING LAPAROSCOPIC (IRRIGATION / IRRIGATOR) ×2 IMPLANT
SET TUBE SMOKE EVAC HIGH FLOW (TUBING) ×2 IMPLANT
SLEEVE XCEL OPT CAN 5 100 (ENDOMECHANICALS) ×4 IMPLANT
SPONGE GAUZE 2X2 STER 10/PKG (GAUZE/BANDAGES/DRESSINGS)
STRIP CLOSURE SKIN 1/2X4 (GAUZE/BANDAGES/DRESSINGS) IMPLANT
SUT MNCRL AB 4-0 PS2 18 (SUTURE) ×2 IMPLANT
SUT VIC AB 0 UR5 27 (SUTURE) IMPLANT
SUT VICRYL 0 TIES 12 18 (SUTURE) IMPLANT
SUT VICRYL 0 UR6 27IN ABS (SUTURE) ×1 IMPLANT
TOWEL OR 17X26 10 PK STRL BLUE (TOWEL DISPOSABLE) ×2 IMPLANT
TOWEL OR NON WOVEN STRL DISP B (DISPOSABLE) ×2 IMPLANT
TRAY LAPAROSCOPIC (CUSTOM PROCEDURE TRAY) ×2 IMPLANT
TROCAR BLADELESS OPT 5 100 (ENDOMECHANICALS) ×2 IMPLANT
TROCAR XCEL BLUNT TIP 100MML (ENDOMECHANICALS) ×1 IMPLANT
TROCAR XCEL NON-BLD 11X100MML (ENDOMECHANICALS) IMPLANT

## 2020-12-13 NOTE — Hospital Course (Addendum)
89yom presented with abd pain, admitted for severe sepsis secondary to acute cholangitis. Bacteremic.  Seen by GI and surgery; s/p ERCP and cholecystectomy 6/22. Doing well. Home likely 6/24.

## 2020-12-13 NOTE — Anesthesia Postprocedure Evaluation (Signed)
Anesthesia Post Note  Patient: Herbert Carr  Procedure(s) Performed: LAPAROSCOPIC CHOLECYSTECTOMY     Patient location during evaluation: PACU Anesthesia Type: General Level of consciousness: awake and alert Pain management: pain level controlled Vital Signs Assessment: post-procedure vital signs reviewed and stable Respiratory status: spontaneous breathing, nonlabored ventilation and respiratory function stable Cardiovascular status: blood pressure returned to baseline and stable Postop Assessment: no apparent nausea or vomiting Anesthetic complications: no   No notable events documented.  Last Vitals:  Vitals:   12/13/20 1730 12/13/20 1748  BP: (!) 129/54 (!) 153/73  Pulse: 62 60  Resp: 19   Temp:  (!) 36.3 C  SpO2: 97% 99%    Last Pain:  Vitals:   12/13/20 1820  TempSrc:   PainSc: [place]

## 2020-12-13 NOTE — Progress Notes (Signed)
Received call from [facility] regarding blood culture drawn 12/11/20 at 2300. Aerobic bottle only collected-growing gram negative rods. BCID detected Klebsiella pneumoniae (no resistance detected). Already called regarding later blood culture collected 12/12/20 at 0355, which is growing same organism. See previous note from Leone Haven, PharmD, for specifics. Notified Dr. Sarajane Jews of this information. Continue Ceftriaxone 2g IV q24h and Metronidazole 500mg  IV q8h. Will continue to monitor culture results/susceptibilities.    Lindell Spar, PharmD, BCPS Clinical Pharmacist  12/13/2020 3:05 PM
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
## 2020-12-13 NOTE — Op Note (Signed)
Herbert Carr [id number] Nov 18, 1940 12/13/2020  Laparoscopic Cholecystectomy  Procedure Note  Indications: This patient presents with symptomatic gallbladder disease and will undergo laparoscopic cholecystectomy.  Patient had been admitted with abdominal pain and increasing LFTs imaging was consistent with cholecystitis.  He underwent ERCP this morning with sphincterotomy.  There was evidence of choledocholithiasis which was cleared by the gastroenterologist.  He was recovering well from his ERCP this morning and was felt stable for laparoscopic cholecystectomy this afternoon.  Pre-operative Diagnosis: Calculus of gallbladder with acute cholecystitis, without mention of obstruction; history of choledocholithiasis  Post-operative Diagnosis: Same  Surgeon: Greer Pickerel MD FACS  Assistants: Alferd Apa PA-C  Anesthesia: General endotracheal anesthesia    Procedure Details  The patient was seen again in the Holding Room. The risks, benefits, complications, treatment options, and expected outcomes were discussed with the patient. The possibilities of reaction to medication, pulmonary aspiration, perforation of viscus, bleeding, recurrent infection, finding a normal gallbladder, the need for additional procedures, failure to diagnose a condition, the possible need to convert to an open procedure, and creating a complication requiring transfusion or operation were discussed with the patient. The likelihood of improving the patient's symptoms with return to their baseline status is good.  The patient and/or family concurred with the proposed plan, giving informed consent. The site of surgery properly noted. The patient was taken to Operating Room, identified as Herbert Carr and the procedure verified as Laparoscopic Cholecystectomy. A Time Out was held and the above information confirmed. Antibiotic prophylaxis was administered.   Prior to the induction of general anesthesia, antibiotic prophylaxis  was administered. General endotracheal anesthesia was then administered and tolerated well. After the induction, the abdomen was prepped with Chloraprep and draped in the sterile fashion. The patient was positioned in the supine position.  Local anesthetic agent was injected into the skin near the umbilicus and an incision made. We dissected down to the abdominal fascia with blunt dissection.  The fascia was incised vertically and we entered the peritoneal cavity bluntly.  A pursestring suture of 0-Vicryl was placed around the fascial opening.  The Hasson cannula was inserted and secured with the stay suture.  Pneumoperitoneum was then created with CO2 and tolerated well without any adverse changes in the patient's vital signs. An 5-mm port was placed in the subxiphoid position.  Two 5-mm ports were placed in the right upper quadrant. All skin incisions were infiltrated with a local anesthetic agent before making the incision and placing the trocars.   We positioned the patient in reverse Trendelenburg, tilted slightly to the patient's left.  There appeared to be some pre-existing air within the epiploic appendages and surrounding mesentery of the hepatic flexure but I saw no contamination.  The gallbladder was identified, the fundus grasped and retracted cephalad. Adhesions were lysed bluntly and with the electrocautery where indicated, taking care not to injure any adjacent organs or viscus.  There was a fair amount of friable fat around the infundibulum.  The infundibulum was grasped and retracted laterally, exposing the peritoneum overlying the triangle of Calot. This was then divided and exposed in a blunt fashion. A critical view of the cystic duct and cystic artery was obtained.  The cystic duct was clearly identified and bluntly dissected circumferentially.  There was also a small posterior branch of the cystic artery  The cystic duct was then ligated with clip and divided.  But the clip did not  completely occlude the cystic duct as expected.  Therefore a  PDS Endoloop was placed around the cystic duct stump.  The cystic artery (both an anterior and posterior branch )which had been identified & dissected free was ligated with clips and divided as well.   The gallbladder was dissected from the liver bed in retrograde fashion with the electrocautery.  There was some spillage of bile from the gallbladder but no gallstones spillage.  The gallbladder was removed and placed in an Ecco sac.  The gallbladder and Ecco sac were then removed through the umbilical port site. The liver bed was irrigated and inspected. Hemostasis was achieved with the electrocautery. Copious irrigation was utilized and was repeatedly aspirated until clear.  I placed a piece of Ethicon surgical snow on the medial segment of the right lobe of the liver that had been attached to the body of the gallbladder.  The pursestring suture was used to close the umbilical fascia.  Additional interrupted 0 Vicryl was placed with the PMI suture passer  We again inspected the right upper quadrant for hemostasis.  The umbilical closure was inspected and there was no air leak and nothing trapped within the closure. Pneumoperitoneum was released as we removed the trocars.  4-0 Monocryl was used to close the skin.  Dermabond was applied.  the patient was then extubated and brought to the recovery room in stable condition. Instrument, sponge, and needle counts were correct at closure and at the conclusion of the case.   Findings: Cholecystitis with Cholelithiasis  Estimated Blood Loss: Minimal         Drains: none         Specimens: Gallbladder           Complications: None; patient tolerated the procedure well.         Disposition: PACU - hemodynamically stable.         Condition: stable  Leighton Ruff. Redmond Pulling, MD, FACS General, Bariatric, & Minimally Invasive Surgery [facility], [place]

## 2020-12-13 NOTE — Anesthesia Procedure Notes (Signed)
Procedure Name: Intubation Date/Time: 12/13/2020 3:19 PM Performed by: Milford Cage, CRNA Pre-anesthesia Checklist: Patient identified, Emergency Drugs available, Suction available and Patient being monitored Patient Re-evaluated:Patient Re-evaluated prior to induction Oxygen Delivery Method: Circle System Utilized Preoxygenation: Pre-oxygenation with 100% oxygen Induction Type: IV induction Laryngoscope Size: Miller and 2 Grade View: Grade I Tube type: Oral Tube size: 7.5 mm Number of attempts: 1 Airway Equipment and Method: Stylet and Oral airway Placement Confirmation: ETT inserted through vocal cords under direct vision, positive ETCO2 and breath sounds checked- equal and bilateral Secured at: 23 cm Tube secured with: Tape Dental Injury: Teeth and Oropharynx as per pre-operative assessment

## 2020-12-13 NOTE — Progress Notes (Signed)
Day of Surgery  Subjective: CC: Patient reports continued burping and belching. No abdominal pain this morning except with palpation. S/p ERCP this AM.   Objective: Vital signs in last 24 hours: Temp:  [97.7 F (36.5 C)-99.5 F (37.5 C)] 97.7 F (36.5 C) (06/22 1044) Pulse Rate:  [51-76] 51 (06/22 1044) Resp:  [16-20] 16 (06/22 1044) BP: (113-134)/(48-82) 114/50 (06/22 1044) SpO2:  [95 %-100 %] 100 % (06/22 1044) Weight:  [71.7 kg] 71.7 kg (06/22 0830) Last BM Date: 12/12/20  Intake/Output from previous day: 06/21 0701 - 06/22 0700 In: 1145.2 [I.V.:945.2; IV Piggyback:200] Out: -  Intake/Output this shift: Total I/O In: 700 [I.V.:700] Out: -   PE: Gen:  Alert, NAD, pleasant Card:  RRR Pulm:  CTAB, no W/R/R, effort normal Abd: Soft, ND, RUQ tenderness without peritonits, +BS Ext:  No LE edema  Psych: A&Ox3  Skin: no rashes noted, warm and dry  Lab Results:  Recent Labs    12/12/20 0957 12/13/20 0453  WBC 14.8* 7.3  HGB 13.2 11.6*  HCT 38.4* 34.5*  PLT 131* 95*   BMET Recent Labs    12/12/20 0957 12/13/20 0453  NA 136 134*  K 4.3 4.7  CL 103 104  CO2 28 23  GLUCOSE 115* 96  BUN 17 11  CREATININE 0.80 0.41*  CALCIUM 8.8* 8.0*   PT/INR Recent Labs    12/11/20 2308  LABPROT 14.6  INR 1.1   CMP     Component Value Date/Time   NA 134 (L) 12/13/2020 0453   K 4.7 12/13/2020 0453   CL 104 12/13/2020 0453   CO2 23 12/13/2020 0453   GLUCOSE 96 12/13/2020 0453   BUN 11 12/13/2020 0453   CREATININE 0.41 (L) 12/13/2020 0453   CALCIUM 8.0 (L) 12/13/2020 0453   PROT 5.1 (L) 12/13/2020 0453   PROT 6.9 05/20/2017 1149   ALBUMIN 3.0 (L) 12/13/2020 0453   ALBUMIN 4.5 05/20/2017 1149   AST 151 (H) 12/13/2020 0453   ALT 254 (H) 12/13/2020 0453   ALKPHOS 71 12/13/2020 0453   BILITOT 6.3 (H) 12/13/2020 0453   BILITOT 1.0 05/20/2017 1149   GFRNONAA >60 12/13/2020 0453   GFRAA >60 03/27/2017 0320   Lipase     Component Value Date/Time   LIPASE  29 03/22/2017 0054       Studies/Results: CT Head Wo Contrast  Result Date: 12/12/2020 CLINICAL DATA:  Numbness or tingling.  Paresthesia. EXAM: CT HEAD WITHOUT CONTRAST TECHNIQUE: Contiguous axial images were obtained from the base of the skull through the vertex without intravenous contrast. COMPARISON:  09/15/2017 FINDINGS: Brain: No evidence of acute infarction, hemorrhage, hydrocephalus, extra-axial collection or mass lesion/mass effect. Vascular: No hyperdense vessel or unexpected calcification. Skull: Metallic fragment just below the tip of the clivus. No acute finding. 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 No acute finding IMPRESSION: No acute or reversible finding. Electronically Signed   By: Monte Fantasia M.D.   On: 12/12/2020 06:33   DG Chest Port 1 View  Result Date: 12/11/2020 CLINICAL DATA:  Possible sepsis EXAM: PORTABLE CHEST 1 VIEW COMPARISON:  01/02/2020 FINDINGS: The heart size and mediastinal contours are within normal limits. Both lungs are clear. The visualized skeletal structures are unremarkable. IMPRESSION: No active disease. Electronically Signed   By: Donavan Foil M.D.   On: 12/11/2020 23:26   DG ERCP BILIARY & PANCREATIC DUCTS  Result Date: 12/13/2020 CLINICAL DATA:  80 year old male with a history of biliary obstruction EXAM: ERCP TECHNIQUE:  Multiple spot images obtained with the fluoroscopic device and submitted for interpretation post-procedure. FLUOROSCOPY TIME:  Fluoroscopy Time:  1 minutes 58 seconds COMPARISON:  None. FINDINGS: Limited intraoperative fluoroscopic spot images during ERCP. Initial image demonstrates endoscope projecting over the upper abdomen with safety wire position and partial opacification of the extrahepatic biliary ducts. Final image demonstrates partial opacification of the extrahepatic biliary ducts. IMPRESSION: Limited images during ERCP, as above. Please refer to the dictated operative report for full details of  intraoperative findings and procedure. Electronically Signed   By: Corrie Mckusick D.O.   On: 12/13/2020 10:29   CT Renal Stone Study  Result Date: 12/12/2020 CLINICAL DATA:  Flank pain.  Evaluate for kidney stone. EXAM: CT ABDOMEN AND PELVIS WITHOUT CONTRAST TECHNIQUE: Multidetector CT imaging of the abdomen and pelvis was performed following the standard protocol without IV contrast. COMPARISON:  03/05/2012 FINDINGS: Lower chest: No acute abnormality. Hepatobiliary: No focal liver abnormality. Calcified granuloma identified in the posterior right hepatic lobe. Small gallstone measures 5 mm. No gallbladder inflammation or bile duct dilatation. Pancreas: Unremarkable. No pancreatic ductal dilatation or surrounding inflammatory changes. Spleen: Normal in size without focal abnormality. Adrenals/Urinary Tract: Left adrenal nodule measures 1.4 cm, image 15/2. Unchanged compared with 03/25/2017. Left adrenal gland is normal. No kidney stones, hydronephrosis or mass. No hydroureter or ureteral lithiasis identified bilaterally. No focal filling defects identified within the bladder. Posterior bladder cystocele noted. Stomach/Bowel: Stomach appears within normal limits. The appendix is visualized and is normal. No bowel wall thickening, inflammation, or distension. Vascular/Lymphatic: Aortic atherosclerosis. No aneurysm. No abdominopelvic adenopathy. Reproductive: Multiple seed implants identified within the prostate gland. Other: No ascites or focal fluid collections. Musculoskeletal: No acute or significant osseous findings. IMPRESSION: 1. No acute findings within the abdomen or pelvis. No urinary tract calculi identified. 2. Posterior bladder cystocele. 3. Gallstone. 4. Stable left adrenal nodule, likely benign adenoma. 5. Aortic atherosclerosis. Aortic Atherosclerosis (ICD10-I70.0). Electronically Signed   By: Kerby Moors M.D.   On: 12/12/2020 02:30   US Abdomen Limited RUQ (LIVER/GB)  Result Date:  12/12/2020 CLINICAL DATA:  Evaluation for biliary obstruction. EXAM: ULTRASOUND ABDOMEN LIMITED RIGHT UPPER QUADRANT COMPARISON:  CT 12/12/2020. FINDINGS: Gallbladder: Multiple gallstones measuring up to 1.3 cm noted. Gallbladder wall thickness 2.5 mm. Negative Murphy sign. Common bile duct: Diameter: 4.6 mm Liver: No focal lesion identified. Within normal limits in parenchymal echogenicity. Portal vein is patent on color Doppler imaging with normal direction of blood flow towards the liver. Other: None. IMPRESSION: Multiple gallstones. No evidence of cholecystitis or biliary distention. Electronically Signed   By: Marcello Moores  Register   On: 12/12/2020 12:34    Anti-infectives: Anti-infectives (From admission, onward)    Start     Dose/Rate Route Frequency Ordered Stop   12/13/20 1200  vancomycin (VANCOREADY) IVPB 1750 mg/350 mL  Status:  Discontinued        1,750 mg 175 mL/hr over 120 Minutes Intravenous Every 24 hours 12/12/20 1000 12/13/20 0100   12/13/20 0500  cefTRIAXone (ROCEPHIN) 2 g in sodium chloride 0.9 % 100 mL IVPB        2 g 200 mL/hr over 30 Minutes Intravenous Every 24 hours 12/13/20 0101     12/12/20 1600  metroNIDAZOLE (FLAGYL) IVPB 500 mg        500 mg 100 mL/hr over 60 Minutes Intravenous Every 8 hours 12/12/20 1355     12/12/20 1200  ceFEPIme (MAXIPIME) 2 g in sodium chloride 0.9 % 100 mL IVPB  Status:  Discontinued  2 g 200 mL/hr over 30 Minutes Intravenous Every 8 hours 12/12/20 0951 12/13/20 0100   12/12/20 1030  vancomycin (VANCOREADY) IVPB 750 mg/150 mL        750 mg 150 mL/hr over 60 Minutes Intravenous  Once 12/12/20 0953 12/12/20 1131   12/12/20 0400  vancomycin (VANCOCIN) IVPB 1000 mg/200 mL premix        1,000 mg 200 mL/hr over 60 Minutes Intravenous  Once 12/12/20 0349 12/12/20 0539   12/12/20 0400  ceFEPIme (MAXIPIME) 2 g in sodium chloride 0.9 % 100 mL IVPB        2 g 200 mL/hr over 30 Minutes Intravenous STAT 12/12/20 0349 12/12/20 0437         Assessment/Plan Choledocholithiasis w/ Elevated LFT's - hepatitis panel negative. S/p ERCP today by Dr. Watt Climes where balloon extraction and bilary sphincterotomy was performed.  Symptomatic cholelithiasis, possible cholecystitis - Will plan for Laparoscopic Cholecystectomy today. I have explained the procedure, risks, and aftercare of Laparoscopic cholecystectomy.  Risks include but are not limited to anesthesia (MI, CVA, death), bleeding, infection, wound problems, hernia, bile leak, injury to common bile duct/liver/intestine and diarrhea post op. Cont abx. He seems to understand and agrees to proceed.  FEN - NPO VTE - SCDs,  ID - Rocephin/Flagyl  Per medicine: CAD s/p DES in proximal LAD HTN Hypothyroidism Left adrenal nodule   LOS: 1 day    Jillyn Ledger , [facility] 12/13/2020, 11:21 AM Please see Amion for pager number during day hours 7:00am-4:30pm

## 2020-12-13 NOTE — Progress Notes (Signed)
PROGRESS NOTE  Herbert Carr:[id number] DOB: June 07, 1941 DOA: 12/12/2020 PCP: [place] Nation, MD  Brief History   24yom presented with abd pain, admitted for severe sepsis secondary to acute cholangitis.  A & P  Severe sepsis secondary to Klebsiella bacteremia secondary to acute cholangitis, acute cholecystitis --Status post ERCP and sphincterotomy 6/22 --Status postcholecystectomy 6/22 --Blood cultures positive.  Continue antibiotics.  Follow-up culture data.  Fortunately appears hemodynamically and clinically stable.  Elevated LFTs secondary to above --Trend CMP  Weight loss unintentional --Etiology unclear, may be secondary to gallbladder disease --Follow-up gallbladder pathology --Follow-up with gastroenterology as an outpatient for possible steatorrhea  CAD status post DES proximal LAD --Asymptomatic.  Continue aspirin.  Resume metoprolol as blood pressure allows.  Statin on hold given elevated transaminases.  Disposition Plan:  Discussion:   Status is: Inpatient  Remains inpatient appropriate because:IV treatments appropriate due to intensity of illness or inability to take PO and Inpatient level of care appropriate due to severity of illness  Dispo: The patient is from: Home              Anticipated d/c is to: Home              Patient currently is not medically stable to d/c.   Difficult to place patient No  DVT prophylaxis: SCDs Start: 12/12/20 3762   Code Status: Full Code Level of care: Progressive Family Communication: wife at bedside  Murray Hodgkins, MD  [facility] Direct contact: see [email or web address] (further directions at bottom of note if needed) 7PM-7AM contact night coverage as at bottom of note 12/13/2020, 6:35 PM  LOS: 1 day    Consults:  GI General surgery   Procedures:  ERCP Laparoscopic cholecystectomy  Micro Data:  BC Klebsiella   Antimicrobials:    Interval History/Subjective  CC: f/u abd pain  C/o pain with deep  breath  Review of Systems  Respiratory:  Negative for shortness of breath.   Gastrointestinal:  Negative for nausea and vomiting.   Objective   Vitals:  Vitals:   12/13/20 1730 12/13/20 1748  BP: (!) 129/54 (!) 153/73  Pulse: 62 60  Resp: 19   Temp:  (!) 97.4 F (36.3 C)  SpO2: 97% 99%    Exam: Physical Exam Vitals reviewed.  Constitutional:      General: He is not in acute distress.    Appearance: He is not ill-appearing or toxic-appearing.  Cardiovascular:     Rate and Rhythm: Normal rate and regular rhythm.     Heart sounds: No murmur heard.   No gallop.  Pulmonary:     Effort: Pulmonary effort is normal. No respiratory distress.     Breath sounds: Normal breath sounds. No wheezing or rales.  Psychiatric:        Mood and Affect: Mood normal.        Behavior: Behavior normal.    I have personally reviewed the labs and other data, making special note of:   Today's Data  AST and ALT trending down Tbili slightly up 6.4 Procalcitonin 18 WBC down to 7.3 Plts down to 95 Culture data noted TSH low   Scheduled Meds:  aspirin EC  81 mg Oral Daily   docusate sodium  100 mg Oral BID   levothyroxine  150 mcg Oral QAC breakfast   pantoprazole (PROTONIX) IV  40 mg Intravenous Q12H   polyethylene glycol  17 g Oral Daily   Continuous Infusions:  sodium chloride 100 mL/hr at 12/12/20 2317  cefTRIAXone (ROCEPHIN)  IV 2 g (12/13/20 0530)    Principal Problem:   Severe sepsis ([place]) Active Problems:   Essential hypertension   Hypothyroidism   CAD S/P PCI   Anxiety   Hyponatremia   Bacteremia   Acute cholangitis   Acute cholecystitis   Weight loss   LOS: 1 day   How to contact the [facility] Attending or Consulting provider 7A - 7P or covering provider during after hours Carl, for this patient?  Check the care team in [facility] and look for a) attending/consulting [facility] provider listed and b) the [facility] team listed Log into [email or web address] and use [place]'s universal  password to access. If you do not have the password, please contact the hospital operator. Locate the [facility] provider you are looking for under [facility] and page to a number that you can be directly reached. If you still have difficulty reaching the provider, please page the [facility] (Director on Call) for the Hospitalists listed on amion for assistance.

## 2020-12-13 NOTE — Anesthesia Preprocedure Evaluation (Signed)
Anesthesia Evaluation  Patient identified by MRN, date of birth, ID band Patient awake    Reviewed: Allergy & Precautions, NPO status , Patient's Chart, lab work & pertinent test results  Airway Mallampati: II  TM Distance: >3 FB Neck ROM: Full    Dental no notable dental hx. (+) Edentulous Upper, Edentulous Lower, Dental Advisory Given   Pulmonary    Pulmonary exam normal breath sounds clear to auscultation       Cardiovascular hypertension, Pt. on medications and Pt. on home beta blockers + CAD, + Past MI and + Cardiac Stents (DES)  Normal cardiovascular exam Rhythm:Regular Rate:Normal  12/12/20 EKG SR R 95 Freq PVCs  03/26/2017 Echo Left ventricle: The cavity size was normal. There was mild focal  basal hypertrophy of the septum. Systolic function was normal.  The estimated ejection fraction was in the range of 55% to 60%.  Apical septal severe hypokinesis. Apical inferior hypokinesis.  Features are consistent with a pseudonormal left ventricular  filling pattern, with concomitant abnormal relaxation and  increased filling pressure (grade 2 diastolic dysfunction).  - Aortic valve: There was no stenosis.  - Aorta: Borderline dilated aortic root. Aortic root dimension: 37  mm (ED).  - Mitral valve: There was mild regurgitation.  - Left atrium: The atrium was mildly dilated.  - Right ventricle: The cavity size was normal. Systolic function  was normal.    Neuro/Psych Anxiety    GI/Hepatic GERD  Medicated,  Endo/Other  Hypothyroidism   Renal/GU negative Renal ROSLab Results      Component                Value               Date                      CREATININE               0.41 (L)            12/13/2020                BUN                      11                  12/13/2020                NA                       134 (L)             12/13/2020                K                        4.7                  12/13/2020                CL                       104                 12/13/2020                CO2  23                  12/13/2020                Musculoskeletal  (+) Arthritis ,   Abdominal   Peds  Hematology Lab Results      Component                Value               Date                      WBC                      7.3                 12/13/2020                HGB                      11.6 (L)            12/13/2020                HCT                      34.5 (L)            12/13/2020                MCV                      95.0                12/13/2020                PLT                      95 (L)              12/13/2020              Anesthesia Other Findings 30lb wt loss over last 2 months  L Eye Blindness  Reproductive/Obstetrics                             Anesthesia Physical Anesthesia Plan  ASA: 3 and emergent  Anesthesia Plan: General   Post-op Pain Management:    Induction: Intravenous  PONV Risk Score and Plan: 3 and Ondansetron, Dexamethasone and Treatment may vary due to age or medical condition  Airway Management Planned: Oral ETT  Additional Equipment:   Intra-op Plan:   Post-operative Plan: Extubation in OR  Informed Consent: I have reviewed the patients History and Physical, chart, labs and discussed the procedure including the risks, benefits and alternatives for the proposed anesthesia with the patient or authorized representative who has indicated his/her understanding and acceptance.     Dental advisory given  Plan Discussed with: CRNA and Anesthesiologist  Anesthesia Plan Comments:         Anesthesia Quick Evaluation

## 2020-12-13 NOTE — Progress Notes (Signed)
PHARMACY - PHYSICIAN COMMUNICATION CRITICAL VALUE ALERT - BLOOD CULTURE IDENTIFICATION (BCID)  Herbert Carr is an 80 y.o. male who presented to [facility] on 12/12/2020 with a chief complaint of sepsis with suspected intra-abdominal source  Assessment:  BCID + Klebsiella pneumoniae in 2/4 bottles (one complete set).  No KCP and no ESBL detected.  Name of physician (or Provider) Contacted: Gershon Cull, NP  Current antibiotics: Vancomycin, Cefepime, and Metronidazole  Changes to prescribed antibiotics recommended:  D/C Vancomycin and Cefepime Begin Ceftriaxone 2gm IV q24h Continue Metronidazole 500mg  IV q8h Recommendations accepted by provider  Results for orders placed or performed during the hospital encounter of 12/12/20  Blood Culture ID Panel (Reflexed) (Collected: 12/12/2020  3:55 AM)  Result Value Ref Range   Enterococcus faecalis NOT DETECTED NOT DETECTED   Enterococcus Faecium NOT DETECTED NOT DETECTED   Listeria monocytogenes NOT DETECTED NOT DETECTED   Staphylococcus species NOT DETECTED NOT DETECTED   Staphylococcus aureus (BCID) NOT DETECTED NOT DETECTED   Staphylococcus epidermidis NOT DETECTED NOT DETECTED   Staphylococcus lugdunensis NOT DETECTED NOT DETECTED   Streptococcus species NOT DETECTED NOT DETECTED   Streptococcus agalactiae NOT DETECTED NOT DETECTED   Streptococcus pneumoniae NOT DETECTED NOT DETECTED   Streptococcus pyogenes NOT DETECTED NOT DETECTED   A.calcoaceticus-baumannii NOT DETECTED NOT DETECTED   Bacteroides fragilis NOT DETECTED NOT DETECTED   Enterobacterales DETECTED (A) NOT DETECTED   Enterobacter cloacae complex NOT DETECTED NOT DETECTED   Escherichia coli NOT DETECTED NOT DETECTED   Klebsiella aerogenes NOT DETECTED NOT DETECTED   Klebsiella oxytoca NOT DETECTED NOT DETECTED   Klebsiella pneumoniae DETECTED (A) NOT DETECTED   Proteus species NOT DETECTED NOT DETECTED   Salmonella species NOT DETECTED NOT DETECTED   Serratia  marcescens NOT DETECTED NOT DETECTED   Haemophilus influenzae NOT DETECTED NOT DETECTED   Neisseria meningitidis NOT DETECTED NOT DETECTED   Pseudomonas aeruginosa NOT DETECTED NOT DETECTED   Stenotrophomonas maltophilia NOT DETECTED NOT DETECTED   Candida albicans NOT DETECTED NOT DETECTED   Candida auris NOT DETECTED NOT DETECTED   Candida glabrata NOT DETECTED NOT DETECTED   Candida krusei NOT DETECTED NOT DETECTED   Candida parapsilosis NOT DETECTED NOT DETECTED   Candida tropicalis NOT DETECTED NOT DETECTED   Cryptococcus neoformans/gattii NOT DETECTED NOT DETECTED   CTX-M ESBL NOT DETECTED NOT DETECTED   Carbapenem resistance IMP NOT DETECTED NOT DETECTED   Carbapenem resistance KPC NOT DETECTED NOT DETECTED   Carbapenem resistance NDM NOT DETECTED NOT DETECTED   Carbapenem resist OXA 48 LIKE NOT DETECTED NOT DETECTED   Carbapenem resistance VIM NOT DETECTED NOT DETECTED    Everette Rank, PharmD 12/13/2020  1:02 AM

## 2020-12-13 NOTE — Anesthesia Procedure Notes (Signed)
Procedure Name: Intubation Date/Time: 12/13/2020 9:12 AM Performed by: Milford Cage, CRNA Pre-anesthesia Checklist: Patient identified, Emergency Drugs available, Suction available and Patient being monitored Patient Re-evaluated:Patient Re-evaluated prior to induction Oxygen Delivery Method: Circle System Utilized Preoxygenation: Pre-oxygenation with 100% oxygen Induction Type: IV induction Ventilation: Unable to mask ventilate Laryngoscope Size: Miller and 2 Grade View: Grade I Tube type: Oral Tube size: 7.5 mm Number of attempts: 1 Airway Equipment and Method: Stylet and Oral airway Placement Confirmation: ETT inserted through vocal cords under direct vision, positive ETCO2 and breath sounds checked- equal and bilateral Secured at: 23 cm Tube secured with: Tape Dental Injury: Teeth and Oropharynx as per pre-operative assessment  Comments: Difficult to mask ventilate effectively d/t beard and edentulous.

## 2020-12-13 NOTE — Progress Notes (Signed)
Herbert Carr 9:10 AM  Subjective: Other than burping patient has no other complaints iand his hospital computer chart reviewed and his case discussed with our PA and my partner Dr. Cristina Gong and he has not had any previous gallbladder issues  Objective: Vital signs stable afebrile no acute distress exam please see preassessment evaluation ultrasound CT and labs reviewed bili increased other liver test decreased BUN and creatinine okay White count okay  Assessment: Probable CBD stones  Plan: The risks benefits and methods and success rate of ERCP and probable stone extraction was discussed with the patient and will proceed today with anesthesia assistance and if all goes well probable lap chole tomorrow  [facility]  office [phone] After 5PM or if no answer call [phone]

## 2020-12-13 NOTE — Anesthesia Preprocedure Evaluation (Signed)
Anesthesia Evaluation  Patient identified by MRN, date of birth, ID band Patient awake    Reviewed: Allergy & Precautions, NPO status , Patient's Chart, lab work & pertinent test results  History of Anesthesia Complications Negative for: history of anesthetic complications  Airway Mallampati: II  TM Distance: >3 FB Neck ROM: Full    Dental  (+) Dental Advisory Given, Edentulous Upper, Edentulous Lower   Pulmonary neg pulmonary ROS,    Pulmonary exam normal        Cardiovascular hypertension, + CAD and + Cardiac Stents (2018)  Normal cardiovascular exam     Neuro/Psych negative neurological ROS     GI/Hepatic Neg liver ROS, GERD  ,Jaundice with suspected biliary obstruction   Endo/Other  diabetes, Type 2Hypothyroidism   Renal/GU negative Renal ROS  negative genitourinary   Musculoskeletal negative musculoskeletal ROS (+)   Abdominal   Peds  Hematology  (+) anemia ,   Anesthesia Other Findings  Echo 2018: Normal LV size with mild focal basal septal hypertrophy. EF 55-60%. Severe apical septal hypokinesis and apical inferior hypokinesis. Moderate diastolic dysfunction. Normal RV size and systolic function. Mild mitral regurgitation.   Reproductive/Obstetrics                             Anesthesia Physical Anesthesia Plan  ASA: 3  Anesthesia Plan: General   Post-op Pain Management:    Induction: Intravenous  PONV Risk Score and Plan: Ondansetron, Dexamethasone, Treatment may vary due to age or medical condition and Midazolam  Airway Management Planned: Oral ETT  Additional Equipment: None  Intra-op Plan:   Post-operative Plan: Extubation in OR  Informed Consent: I have reviewed the patients History and Physical, chart, labs and discussed the procedure including the risks, benefits and alternatives for the proposed anesthesia with the patient or authorized representative who has  indicated his/her understanding and acceptance.     Dental advisory given  Plan Discussed with:   Anesthesia Plan Comments:         Anesthesia Quick Evaluation

## 2020-12-13 NOTE — Discharge Instructions (Signed)
[facility]  Please arrive at least 30 min before your appointment to complete your check in paperwork.  If you are unable to arrive 30 min prior to your appointment time we may have to cancel or reschedule you. LAPAROSCOPIC SURGERY: POST OP INSTRUCTIONS Always review your discharge instruction sheet given to you by the facility where your surgery was performed. IF YOU HAVE DISABILITY OR FAMILY LEAVE FORMS, YOU MUST BRING THEM TO THE OFFICE FOR PROCESSING.   DO NOT GIVE THEM TO YOUR DOCTOR.  PAIN CONTROL  First take acetaminophen (Tylenol) AND/or ibuprofen (Advil) to control your pain after surgery.  Follow directions on package.  Taking acetaminophen (Tylenol) and/or ibuprofen (Advil) regularly after surgery will help to control your pain and lower the amount of prescription pain medication you may need.  You should not take more than 4,000 mg (4 grams) of acetaminophen (Tylenol) in 24 hours.  You should not take ibuprofen (Advil), aleve, motrin, naprosyn or other NSAIDS if you have a history of stomach ulcers or chronic kidney disease.  A prescription for pain medication may be given to you upon discharge.  Take your pain medication as prescribed, if you still have uncontrolled pain after taking acetaminophen (Tylenol) or ibuprofen (Advil). Use ice packs to help control pain. If you need a refill on your pain medication, please contact your pharmacy.  They will contact our office to request authorization. Prescriptions will not be filled after 5pm or on week-ends.  HOME MEDICATIONS Take your usually prescribed medications unless otherwise directed.  DIET You should follow a light diet the first few days after arrival home.  Be sure to include lots of fluids daily. Avoid fatty, fried foods.   CONSTIPATION It is common to experience some constipation after surgery and if you are taking pain medication.  Increasing fluid intake and taking a stool softener (such as Colace)  will usually help or prevent this problem from occurring.  A mild laxative (Milk of Magnesia or Miralax) should be taken according to package instructions if there are no bowel movements after 48 hours.  WOUND/INCISION CARE Most patients will experience some swelling and bruising in the area of the incisions.  Ice packs will help.  Swelling and bruising can take several days to resolve.  Unless discharge instructions indicate otherwise, follow guidelines below  STERI-STRIPS - you may remove your outer bandages 48 hours after surgery, and you may shower at that time.  You have steri-strips (small skin tapes) in place directly over the incision.  These strips should be left on the skin for 7-10 days.   DERMABOND/SKIN GLUE - you may shower in 24 hours.  The glue will flake off over the next 2-3 weeks. Any sutures or staples will be removed at the office during your follow-up visit.  ACTIVITIES You may resume regular (light) daily activities beginning the next day--such as daily self-care, walking, climbing stairs--gradually increasing activities as tolerated.  You may have sexual intercourse when it is comfortable.  Refrain from any heavy lifting or straining until approved by your doctor. You may drive when you are no longer taking prescription pain medication, you can comfortably wear a seatbelt, and you can safely maneuver your car and apply brakes.  FOLLOW-UP You should see your doctor in the office for a follow-up appointment approximately 2-3 weeks after your surgery.  You should have been given your post-op/follow-up appointment when your surgery was scheduled.  If you did not receive a post-op/follow-up appointment, make sure   that you call for this appointment within a day or two after you arrive home to insure a convenient appointment time.   WHEN TO CALL YOUR DOCTOR: Fever over 101.0 Inability to urinate Continued bleeding from incision. Increased pain, redness, or drainage from the  incision. Increasing abdominal pain  The clinic staff is available to answer your questions during regular business hours.  Please don't hesitate to call and ask to speak to one of the nurses for clinical concerns.  If you have a medical emergency, go to the nearest emergency room or call 911.  A surgeon from [facility] is always on call at the hospital. [address], [address], [place], [place]  [address] ? [address] [address], [place], [place]   [address] [phone] ? [phone] ? FAX [phone]  

## 2020-12-13 NOTE — Op Note (Signed)
[facility] Patient Name: Herbert Carr Procedure Date: 12/13/2020 MRN: [id number] Attending MD: Clarene Essex , MD Date of Birth: 1941-06-23 CSN: [id number] Age: 80 Admit Type: Inpatient Procedure:                ERCP Indications:              Evaluation and possible treatment of bile duct                            stone(s) Providers:                Clarene Essex, MD, Elmer Ramp. Tilden Dome, RN, Fransico Setters                            Mbumina, Technician Referring MD:              Medicines:                General Anesthesia Complications:            No immediate complications. Estimated Blood Loss:     Estimated blood loss: none. Procedure:                Pre-Anesthesia Assessment:                           - Prior to the procedure, a History and Physical                            was performed, and patient medications and                            allergies were reviewed. The patient's tolerance of                            previous anesthesia was also reviewed. The risks                            and benefits of the procedure and the sedation                            options and risks were discussed with the patient.                            All questions were answered, and informed consent                            was obtained. Prior Anticoagulants: The patient has                            taken Lovenox (enoxaparin), last dose was 1 day                            prior to procedure. ASA Grade Assessment: III - A                            patient with  severe systemic disease. After                            reviewing the risks and benefits, the patient was                            deemed in satisfactory condition to undergo the                            procedure.                           After obtaining informed consent, the scope was                            passed under direct vision. Throughout the                            procedure, the patient's blood  pressure, pulse, and                            oxygen saturations were monitored continuously. The                            TJF-Q180V ([id number]) Olympus Duodenoscope was                            introduced through the mouth, and used to inject                            contrast into and used to locate the major papilla.                            The ERCP was accomplished without difficulty. The                            patient tolerated the procedure well. Scope In: Scope Out: Findings:      The major papilla was normal. There was a red spot on the papilla       probably from a passed stone and deep selective cannulation was readily       obtained and there was no pancreatic duct injection or wire advancement       throughout the procedure and on initial cholangiogram we thought some       obvious stones were seen but retrospectively were probably air bubbles       and we proceeded with a biliary sphincterotomy was made with a Hydratome       sphincterotome using ERBE electrocautery. There was no       post-sphincterotomy bleeding. We proceeded until we had adequate biliary       drainage and could get the fully bowed sphincterotome easily in and out       of the duct and again it appeared choledocholithiasis was found in a       nondilated duct. The stones all appeared to be small in diameter. To       discover  objects, the biliary tree was swept with an adjustable 9- 12 mm       balloon starting at the bifurcation and and then sweeping the right main       hepatic duct with a 9 mm balloon. Nothing was found on multiple balloon       pull-through's using both size balloons which both passed readily       through the patent sphincterotomy site and we proceeded with an       occlusion cholangiogram in the customary fashion which did not reveal       any residual abnormalities and the patient tolerated the procedure well       there was no immediate complication and the wire balloon  and scope were       removed at this point Impression:               - The major papilla appeared normal.                           - Choledocholithiasis was found. Complete removal                            was accomplished by balloon extraction if any were                            present but none were seen passing through the                            patent sphincterotomy site and retrospectively the                            initial cholangiogram probably revealed air bubbles.                           - A biliary sphincterotomy was performed.                           - The biliary tree was swept and nothing was found                            at the end of the procedure. There was no                            pancreatic duct injection or wire advancement                            throughout the procedure Moderate Sedation:      Not Applicable - Patient had care per Anesthesia. Recommendation:           - NPO In case lap chole to be done today otherwise                            will allow clear liquids per surgical team-                            Continue present medications.                           -  Return to GI clinic PRN.                           - Telephone GI clinic if symptomatic PRN.                           - Check liver enzymes (AST, ALT, alkaline                            phosphatase, bilirubin) in the morning. And follow                            them back to normal as an outpatient Procedure Code(s):        --- Professional ---                           [phone], Esophagogastroduodenoscopy, flexible,                            transoral; diagnostic, including collection of                            specimen(s) by brushing or washing, when performed                            (separate procedure) Diagnosis Code(s):        --- Professional ---                           K80.50, Calculus of bile duct without cholangitis                            or  cholecystitis without obstruction CPT copyright 2019 [employer] Association. All rights reserved. The codes documented in this report are preliminary and upon coder review may  be revised to meet current compliance requirements. Clarene Essex, MD 12/13/2020 9:58:36 AM This report has been signed electronically. Number of Addenda: 0

## 2020-12-13 NOTE — Transfer of Care (Signed)
Immediate Anesthesia Transfer of Care Note  Patient: Herbert Carr  Procedure(s) Performed: ENDOSCOPIC RETROGRADE CHOLANGIOPANCREATOGRAPHY (ERCP)  Patient Location: PACU  Anesthesia Type:General  Level of Consciousness: awake  Airway & Oxygen Therapy: Patient Spontanous Breathing  Post-op Assessment: Report given to RN and Post -op Vital signs reviewed and stable  Post vital signs: Reviewed and stable  Last Vitals:  Vitals Value Taken Time  BP 134/62 12/13/20 0955  Temp 37.5 C 12/13/20 0955  Pulse 70 12/13/20 1000  Resp 19 12/13/20 1000  SpO2 96 % 12/13/20 1000    Last Pain:  Vitals:   12/13/20 0955  TempSrc: Oral  PainSc: 0-No pain      Patients Stated Pain Goal: 0 (67/01/10 0349)  Complications: No notable events documented.

## 2020-12-13 NOTE — Transfer of Care (Signed)
Immediate Anesthesia Transfer of Care Note  Patient: Herbert Carr  Procedure(s) Performed: LAPAROSCOPIC CHOLECYSTECTOMY  Patient Location: PACU  Anesthesia Type:General  Level of Consciousness: awake  Airway & Oxygen Therapy: Patient Spontanous Breathing  Post-op Assessment: Report given to RN and Post -op Vital signs reviewed and stable  Post vital signs: Reviewed and stable  Last Vitals:  Vitals Value Taken Time  BP 165/69 12/13/20 1647  Temp    Pulse 72 12/13/20 1649  Resp 14 12/13/20 1649  SpO2 97 % 12/13/20 1649  Vitals shown include unvalidated device data.  Last Pain:  Vitals:   12/13/20 1227  TempSrc:   PainSc: 0-No pain      Patients Stated Pain Goal: 0 (91/47/82 9562)  Complications: No notable events documented.

## 2020-12-14 ENCOUNTER — Encounter (HOSPITAL_COMMUNITY): Payer: Self-pay | Admitting: General Surgery

## 2020-12-14 DIAGNOSIS — R7989 Other specified abnormal findings of blood chemistry: Secondary | ICD-10-CM

## 2020-12-14 DIAGNOSIS — B961 Klebsiella pneumoniae [K. pneumoniae] as the cause of diseases classified elsewhere: Secondary | ICD-10-CM

## 2020-12-14 DIAGNOSIS — I2510 Atherosclerotic heart disease of native coronary artery without angina pectoris: Secondary | ICD-10-CM

## 2020-12-14 DIAGNOSIS — Z9861 Coronary angioplasty status: Secondary | ICD-10-CM

## 2020-12-14 LAB — COMPREHENSIVE METABOLIC PANEL
ALT: 187 U/L — ABNORMAL HIGH (ref 0–44)
AST: 63 U/L — ABNORMAL HIGH (ref 15–41)
Albumin: 3.2 g/dL — ABNORMAL LOW (ref 3.5–5.0)
Alkaline Phosphatase: 81 U/L (ref 38–126)
Anion gap: 5 (ref 5–15)
BUN: 11 mg/dL (ref 8–23)
CO2: 26 mmol/L (ref 22–32)
Calcium: 8.4 mg/dL — ABNORMAL LOW (ref 8.9–10.3)
Chloride: 104 mmol/L (ref 98–111)
Creatinine, Ser: 0.7 mg/dL (ref 0.61–1.24)
GFR, Estimated: >60 mL/min (ref >60)
Glucose, Bld: 159 mg/dL — ABNORMAL HIGH (ref 70–99)
Potassium: 4.4 mmol/L (ref 3.5–5.1)
Sodium: 135 mmol/L (ref 135–145)
Total Bilirubin: 2.1 mg/dL — ABNORMAL HIGH (ref 0.3–1.2)
Total Protein: 5.7 g/dL — ABNORMAL LOW (ref 6.5–8.1)

## 2020-12-14 LAB — CBC
HCT: 35 % — ABNORMAL LOW (ref 39.0–52.0)
Hemoglobin: 11.7 g/dL — ABNORMAL LOW (ref 13.0–17.0)
MCH: 32.1 pg (ref 26.0–34.0)
MCHC: 33.4 g/dL (ref 30.0–36.0)
MCV: 95.9 fL (ref 80.0–100.0)
Platelets: 100 10*3/uL — ABNORMAL LOW (ref 150–400)
RBC: 3.65 MIL/uL — ABNORMAL LOW (ref 4.22–5.81)
RDW: 12.2 % (ref 11.5–15.5)
WBC: 8.1 10*3/uL (ref 4.0–10.5)
nRBC: 0 % (ref 0.0–0.2)

## 2020-12-14 LAB — CULTURE, BLOOD (SINGLE): Special Requests: ADEQUATE

## 2020-12-14 MED ORDER — CLONAZEPAM 0.125 MG PO TBDP
0.5000 mg | ORAL_TABLET | Freq: Every day | ORAL | Status: DC
  Administered 2020-12-14: 0.5 mg via ORAL
  Filled 2020-12-14: qty 4

## 2020-12-14 MED ORDER — HYDRALAZINE HCL 25 MG PO TABS: 25.0000 mg | ORAL_TABLET | Freq: Four times a day (QID) | ORAL | Status: DC | PRN

## 2020-12-14 MED ORDER — METOPROLOL TARTRATE 25 MG PO TABS
25.0000 mg | ORAL_TABLET | Freq: Two times a day (BID) | ORAL | Status: DC
  Administered 2020-12-14 – 2020-12-15 (×2): 25 mg via ORAL
  Filled 2020-12-14 (×3): qty 1

## 2020-12-14 MED ORDER — OXYCODONE HCL 5 MG PO TABS: 5.0000 mg | ORAL_TABLET | Freq: Four times a day (QID) | ORAL | 0 refills | Status: DC | PRN

## 2020-12-14 NOTE — Assessment & Plan Note (Signed)
-   as above

## 2020-12-14 NOTE — Progress Notes (Addendum)
1 Day Post-Op  Subjective: CC: Doing well.  Only having 1/10 abdominal pain around his incisions.  Tolerating diet without nausea or vomiting.  Has been mobilizing well to and from the bathroom.  Voiding without difficulty. BM yesterday.   Objective: Vital signs in last 24 hours: Temp:  [97.4 F (36.3 C)-99.5 F (37.5 C)] 98.1 F (36.7 C) (06/23 0835) Pulse Rate:  [50-79] 56 (06/23 0835) Resp:  [15-21] 20 (06/23 0835) BP: (114-165)/(48-116) 142/64 (06/23 0835) SpO2:  [95 %-100 %] 99 % (06/23 0835) Last BM Date: 12/13/20  Intake/Output from previous day: 06/22 0701 - 06/23 0700 In: 1623.3 [I.V.:1423.3; IV Piggyback:200] Out: 450 [Urine:400; Blood:50] Intake/Output this shift: Total I/O In: 240 [P.O.:240] Out: -   PE: Gen:  Alert, NAD, pleasant Card:  Reg Pulm:  Normal rate and effort  Abd: Soft, ND, appropriately tender around laparoscopic incisions, +BS, Incisions with glue intact appears well and are without drainage, bleeding, or signs of infection Psych: A&Ox3  Skin: no rashes noted, warm and dry  Lab Results:  Recent Labs    12/13/20 0453 12/14/20 0529  WBC 7.3 8.1  HGB 11.6* 11.7*  HCT 34.5* 35.0*  PLT 95* 100*   BMET Recent Labs    12/13/20 0453 12/14/20 0529  NA 134* 135  K 4.7 4.4  CL 104 104  CO2 23 26  GLUCOSE 96 159*  BUN 11 11  CREATININE 0.41* 0.70  CALCIUM 8.0* 8.4*   PT/INR Recent Labs    12/11/20 2308  LABPROT 14.6  INR 1.1   CMP     Component Value Date/Time   NA 135 12/14/2020 0529   K 4.4 12/14/2020 0529   CL 104 12/14/2020 0529   CO2 26 12/14/2020 0529   GLUCOSE 159 (H) 12/14/2020 0529   BUN 11 12/14/2020 0529   CREATININE 0.70 12/14/2020 0529   CALCIUM 8.4 (L) 12/14/2020 0529   PROT 5.7 (L) 12/14/2020 0529   PROT 6.9 05/20/2017 1149   ALBUMIN 3.2 (L) 12/14/2020 0529   ALBUMIN 4.5 05/20/2017 1149   AST 63 (H) 12/14/2020 0529   ALT 187 (H) 12/14/2020 0529   ALKPHOS 81 12/14/2020 0529   BILITOT 2.1 (H)  12/14/2020 0529   BILITOT 1.0 05/20/2017 1149   GFRNONAA >60 12/14/2020 0529   GFRAA >60 03/27/2017 0320   Lipase     Component Value Date/Time   LIPASE 29 03/22/2017 0054       Studies/Results: DG ERCP BILIARY & PANCREATIC DUCTS  Result Date: 12/13/2020 CLINICAL DATA:  80 year old male with a history of biliary obstruction EXAM: ERCP TECHNIQUE: Multiple spot images obtained with the fluoroscopic device and submitted for interpretation post-procedure. FLUOROSCOPY TIME:  Fluoroscopy Time:  1 minutes 58 seconds COMPARISON:  None. FINDINGS: Limited intraoperative fluoroscopic spot images during ERCP. Initial image demonstrates endoscope projecting over the upper abdomen with safety wire position and partial opacification of the extrahepatic biliary ducts. Final image demonstrates partial opacification of the extrahepatic biliary ducts. IMPRESSION: Limited images during ERCP, as above. Please refer to the dictated operative report for full details of intraoperative findings and procedure. Electronically Signed   By: Corrie Mckusick D.O.   On: 12/13/2020 10:29   US Abdomen Limited RUQ (LIVER/GB)  Result Date: 12/12/2020 CLINICAL DATA:  Evaluation for biliary obstruction. EXAM: ULTRASOUND ABDOMEN LIMITED RIGHT UPPER QUADRANT COMPARISON:  CT 12/12/2020. FINDINGS: Gallbladder: Multiple gallstones measuring up to 1.3 cm noted. Gallbladder wall thickness 2.5 mm. Negative Murphy sign. Common bile duct: Diameter: 4.6 mm  Liver: No focal lesion identified. Within normal limits in parenchymal echogenicity. Portal vein is patent on color Doppler imaging with normal direction of blood flow towards the liver. Other: None. IMPRESSION: Multiple gallstones. No evidence of cholecystitis or biliary distention. Electronically Signed   By: Marcello Moores  Register   On: 12/12/2020 12:34    Anti-infectives: Anti-infectives (From admission, onward)    Start     Dose/Rate Route Frequency Ordered Stop   12/13/20 1200   vancomycin (VANCOREADY) IVPB 1750 mg/350 mL  Status:  Discontinued        1,750 mg 175 mL/hr over 120 Minutes Intravenous Every 24 hours 12/12/20 1000 12/13/20 0100   12/13/20 0500  cefTRIAXone (ROCEPHIN) 2 g in sodium chloride 0.9 % 100 mL IVPB        2 g 200 mL/hr over 30 Minutes Intravenous Every 24 hours 12/13/20 0101     12/12/20 1600  metroNIDAZOLE (FLAGYL) IVPB 500 mg  Status:  Discontinued        500 mg 100 mL/hr over 60 Minutes Intravenous Every 8 hours 12/12/20 1355 12/13/20 1812   12/12/20 1200  ceFEPIme (MAXIPIME) 2 g in sodium chloride 0.9 % 100 mL IVPB  Status:  Discontinued        2 g 200 mL/hr over 30 Minutes Intravenous Every 8 hours 12/12/20 0951 12/13/20 0100   12/12/20 1030  vancomycin (VANCOREADY) IVPB 750 mg/150 mL        750 mg 150 mL/hr over 60 Minutes Intravenous  Once 12/12/20 0953 12/12/20 1131   12/12/20 0400  vancomycin (VANCOCIN) IVPB 1000 mg/200 mL premix        1,000 mg 200 mL/hr over 60 Minutes Intravenous  Once 12/12/20 0349 12/12/20 0539   12/12/20 0400  ceFEPIme (MAXIPIME) 2 g in sodium chloride 0.9 % 100 mL IVPB        2 g 200 mL/hr over 30 Minutes Intravenous STAT 12/12/20 0349 12/12/20 0437        Assessment/Plan POD 1, s/p laparoscopic cholecystectomy for acute cholecystitis-Dr. Redmond Pulling, 12/13/2020 - The patient is voiding well, tolerating diet, ambulating well, pain well controlled, vital signs stable, incisions c/d/i and felt stable for discharge home from a surgery standpoint.  Follow-up arranged in place and AVS.  Pain medication sent to his pharmacy.  I have reviewed return precautions and post op instructions with the patient.  Will reach out to [facility] to let them know he is okay for discharge from our standpoint. Abx for Kleb PNA bacteremia per [facility]  Choledocholithiasis w/ Elevated LFT's - hepatitis panel negative. S/p ERCP 6/22 by Dr. Watt Climes where balloon extraction and bilary sphincterotomy was performed.   FEN - Reg VTE - SCDs, okay for  chemical DVT prophylaxis from our standpoint ID - Rocephin/Flagyl 6/21 - 6/22   Per medicine: Klebsiella PNA Bacteremia  CAD s/p DES in proximal LAD HTN Hypothyroidism Left adrenal nodule   LOS: 2 days    Jillyn Ledger , [facility] Surgery 12/14/2020, 9:45 AM Please see Amion for pager number during day hours 7:00am-4:30pm

## 2020-12-14 NOTE — Assessment & Plan Note (Signed)
--  suspect from gallbladder disease --f/u as outpatient

## 2020-12-14 NOTE — Anesthesia Postprocedure Evaluation (Signed)
Anesthesia Post Note  Patient: Herbert Carr  Procedure(s) Performed: ENDOSCOPIC RETROGRADE CHOLANGIOPANCREATOGRAPHY (ERCP) SPHINCTEROTOMY REMOVAL OF STONES     Patient location during evaluation: Endoscopy Anesthesia Type: General Level of consciousness: awake and alert Pain management: pain level controlled Vital Signs Assessment: post-procedure vital signs reviewed and stable Respiratory status: spontaneous breathing, nonlabored ventilation and respiratory function stable Cardiovascular status: blood pressure returned to baseline and stable Postop Assessment: no apparent nausea or vomiting Anesthetic complications: no   No notable events documented.  Last Vitals:  Vitals:   12/14/20 0432 12/14/20 0835  BP: (!) 150/62 (!) 142/64  Pulse: (!) 50 (!) 56  Resp: 18 20  Temp: 36.8 C 36.7 C  SpO2: 96% 99%    Last Pain:  Vitals:   12/14/20 0857  TempSrc:   PainSc: 0-No pain                 Lidia Collum

## 2020-12-14 NOTE — Assessment & Plan Note (Signed)
--  asymptomatic --cotninue ASA, metoprolol, statin on discharge

## 2020-12-14 NOTE — Assessment & Plan Note (Addendum)
--  Status post ERCP and sphincterotomy and cholecystecotmy 6/22 --doing well, continue abx, likely home tomorrow

## 2020-12-14 NOTE — Progress Notes (Signed)
[facility] Gastroenterology Progress Note  Herbert Carr 80 y.o. 06-29-1940  CC:  Abnormal LFTs  Subjective: Patient reports feeling well. Has some mild abdominal soreness, currently rated at 1/10.  Denies nausea/vomiting. Has not yet had a BM post-surgery.  ROS : Review of Systems  Cardiovascular:  Negative for chest pain and palpitations.  Gastrointestinal:  Positive for abdominal pain. Negative for blood in stool, constipation, diarrhea, heartburn, melena, nausea and vomiting.     Objective: Vital signs in last 24 hours: Vitals:   12/14/20 0432 12/14/20 0835  BP: (!) 150/62 (!) 142/64  Pulse: (!) 50 (!) 56  Resp: 18 20  Temp: 98.2 F (36.8 C) 98.1 F (36.7 C)  SpO2: 96% 99%    Physical Exam:  General:  Alert, cooperative, no distress  Head:  Normocephalic, without obvious abnormality, atraumatic  Eyes:  Anicteric sclera, EOMs intact  Lungs:   Clear to auscultation bilaterally, respirations unlabored  Heart:  Regular rate and rhythm, S1, S2 normal  Abdomen:   Soft and non-distended with mild incisional tenderness, lap sites approximated with glue, intact. Bowel sounds active all four quadrants  Extremities: Extremities normal, atraumatic, no  edema    Lab Results: Recent Labs    12/12/20 0957 12/13/20 0453 12/14/20 0529  NA 136 134* 135  K 4.3 4.7 4.4  CL 103 104 104  CO2 28 23 26   GLUCOSE 115* 96 159*  BUN 17 11 11   CREATININE 0.80 0.41* 0.70  CALCIUM 8.8* 8.0* 8.4*  MG 2.3  --   --   PHOS 3.0  --   --    Recent Labs    12/13/20 0453 12/14/20 0529  AST 151* 63*  ALT 254* 187*  ALKPHOS 71 81  BILITOT 6.3* 2.1*  PROT 5.1* 5.7*  ALBUMIN 3.0* 3.2*   Recent Labs    12/11/20 2308 12/12/20 0957 12/13/20 0453 12/14/20 0529  WBC 8.1   < > 7.3 8.1  NEUTROABS 7.7  --   --   --   HGB 14.1   < > 11.6* 11.7*  HCT 41.5   < > 34.5* 35.0*  MCV 94.3   < > 95.0 95.9  PLT 167   < > 95* 100*   < > = values in this interval not displayed.   Recent Labs     12/11/20 2308  LABPROT 14.6  INR 1.1    Assessment: Abnormal LFTs: Underwent ERCP and lap chole 12/13/20 -ERCP revealed choledocholithiasis was found. Complete removal was/ accomplished by balloon extraction if any were present but none were seen passing through the patent sphincterotomy site and retrospectively the initial cholangiogram probably revealed air bubbles. -T. Bili 2.1, decreased from 6.3 yesterday.  AST 63/ ALT 187, downtrending. ALP remains normal (81).  -Leukocytosis resolved  Plan: Trend LFTs to normalization as an outpatient (can repeat labs at [facility] FU).  OK to discharge from GI standpoint.  Per [facility], no further antibiotic therapy needed after discharge.  [facility] will sign off. Please contact us if we can be of any further assistance during this hospital stay.  Salley Slaughter PA-C 12/14/2020, 10:02 AM  Contact #  [phone]

## 2020-12-14 NOTE — Assessment & Plan Note (Signed)
--  s/p lap chole 6/22

## 2020-12-14 NOTE — Progress Notes (Signed)
PROGRESS NOTE  Herbert Carr:[id number] DOB: [birth year] DOA: 12/12/2020 PCP: [place] Nation, MD  Brief History   33yom presented with abd pain, admitted for severe sepsis secondary to acute cholangitis. Bacteremic.  Seen by GI and surgery; s/p ERCP and cholecystectomy 6/22. Doing well. Home likely 6/24.  A/P * Severe sepsis (HCC) secondary  To Klebsiella bacteremia secondary to acute cholangitis, cholecystitis --Status post ERCP and sphincterotomy and cholecystecotmy 6/22 --doing well, continue abx, likely home tomorrow  Acute cholecystitis --s/p lap chole 6/22  Acute cholangitis --as above  Elevated LFTs --trending down, no further eval suggested, f/u as outpatient  Weight loss --suspect from gallbladder disease --f/u as outpatient  CAD S/P PCI --asymptomatic --cotninue ASA, metoprolol, statin on discharge  Disposition Plan:  Discussion: better; home tomorrow  Status is: Inpatient  Remains inpatient appropriate because:IV treatments appropriate due to intensity of illness or inability to take PO and Inpatient level of care appropriate due to severity of illness  Dispo: The patient is from: Home              Anticipated d/c is to: Home              Patient currently is not medically stable to d/c.   Difficult to place patient No  DVT prophylaxis: SCDs Start: 12/12/20 9562   Code Status: Full Code Level of care: Progressive Family Communication: wife at bedside 6/23  Murray Hodgkins, MD  [facility] Direct contact: see [email or web address] (further directions at bottom of note if needed) 7PM-7AM contact night coverage as at bottom of note 12/14/2020, 10:15 AM  LOS: 2 days    Consults:  GI General surgery   Procedures:  ERCP Laparoscopic cholecystectomy  Micro Data:  BC Klebsiella   Antimicrobials:  Ceftriaxone 6/21 >   Interval History/Subjective  CC: f/u abd pain  Feels better today, pain 1/10 Ate scrambled eggs  Review of Systems   Respiratory:  Negative for shortness of breath.   Gastrointestinal:  Negative for nausea and vomiting.   Objective   Vitals:  Vitals:   12/14/20 0432 12/14/20 0835  BP: (!) 150/62 (!) 142/64  Pulse: (!) 50 (!) 56  Resp: 18 20  Temp: 98.2 F (36.8 C) 98.1 F (36.7 C)  SpO2: 96% 99%    Exam: Physical Exam Vitals and nursing note reviewed.  Constitutional:      General: He is not in acute distress.    Appearance: Normal appearance. He is not ill-appearing or toxic-appearing.  Cardiovascular:     Rate and Rhythm: Normal rate and regular rhythm.     Heart sounds: No murmur heard.   No gallop.  Pulmonary:     Effort: Pulmonary effort is normal. No respiratory distress.     Breath sounds: Normal breath sounds. No wheezing or rales.  Neurological:     Mental Status: He is alert.  Psychiatric:        Mood and Affect: Mood normal.        Behavior: Behavior normal.    I have personally reviewed the labs and other data, making special note of:   Today's Data  LFTs down Plts upt 10 100 Normal WBC   Scheduled Meds:  aspirin EC  81 mg Oral Daily   clonazepam  0.5 mg Oral QHS   docusate sodium  100 mg Oral BID   levothyroxine  150 mcg Oral QAC breakfast   pantoprazole (PROTONIX) IV  40 mg Intravenous Q12H   polyethylene glycol  17 g  Oral Daily   Continuous Infusions:  sodium chloride 100 mL/hr at 12/14/20 0842   cefTRIAXone (ROCEPHIN)  IV 2 g (12/14/20 0407)    Principal Problem:   Severe sepsis ([place]) secondary  To Klebsiella bacteremia secondary to acute cholangitis, cholecystitis Active Problems:   Bacteremia due to Klebsiella pneumoniae   Acute cholangitis   Acute cholecystitis   Elevated LFTs   Essential hypertension   Hypothyroidism   CAD S/P PCI   Anxiety   Weight loss   LOS: 2 days   How to contact the [facility] Attending or Consulting provider 7A - 7P or covering provider during after hours [place], for this patient?  Check the care team in [facility] and look  for a) attending/consulting [facility] provider listed and b) the [facility] team listed Log into [email or web address] and use [place]'s universal password to access. If you do not have the password, please contact the hospital operator. Locate the [facility] provider you are looking for under [facility] and page to a number that you can be directly reached. If you still have difficulty reaching the provider, please page the [facility] (Director on Call) for the Hospitalists listed on amion for assistance.

## 2020-12-14 NOTE — Assessment & Plan Note (Signed)
--  trending down, no further eval suggested, f/u as outpatient

## 2020-12-15 LAB — COMPREHENSIVE METABOLIC PANEL
ALT: 141 U/L — ABNORMAL HIGH (ref 0–44)
AST: 43 U/L — ABNORMAL HIGH (ref 15–41)
Albumin: 3 g/dL — ABNORMAL LOW (ref 3.5–5.0)
Alkaline Phosphatase: 106 U/L (ref 38–126)
Anion gap: 8 (ref 5–15)
BUN: 8 mg/dL (ref 8–23)
CO2: 26 mmol/L (ref 22–32)
Calcium: 8.2 mg/dL — ABNORMAL LOW (ref 8.9–10.3)
Chloride: 103 mmol/L (ref 98–111)
Creatinine, Ser: 0.57 mg/dL — ABNORMAL LOW (ref 0.61–1.24)
GFR, Estimated: >60 mL/min (ref >60)
Glucose, Bld: 123 mg/dL — ABNORMAL HIGH (ref 70–99)
Potassium: 3.2 mmol/L — ABNORMAL LOW (ref 3.5–5.1)
Sodium: 137 mmol/L (ref 135–145)
Total Bilirubin: 4.7 mg/dL — ABNORMAL HIGH (ref 0.3–1.2)
Total Protein: 5.6 g/dL — ABNORMAL LOW (ref 6.5–8.1)

## 2020-12-15 LAB — CULTURE, BLOOD (SINGLE): Special Requests: ADEQUATE

## 2020-12-15 LAB — CBC
HCT: 36.6 % — ABNORMAL LOW (ref 39.0–52.0)
Hemoglobin: 12.7 g/dL — ABNORMAL LOW (ref 13.0–17.0)
MCH: 32.5 pg (ref 26.0–34.0)
MCHC: 34.7 g/dL (ref 30.0–36.0)
MCV: 93.6 fL (ref 80.0–100.0)
Platelets: 122 10*3/uL — ABNORMAL LOW (ref 150–400)
RBC: 3.91 MIL/uL — ABNORMAL LOW (ref 4.22–5.81)
RDW: 12.4 % (ref 11.5–15.5)
WBC: 10.8 10*3/uL — ABNORMAL HIGH (ref 4.0–10.5)
nRBC: 0 % (ref 0.0–0.2)

## 2020-12-15 LAB — SURGICAL PATHOLOGY

## 2020-12-15 MED ORDER — CEPHALEXIN 500 MG PO CAPS: 500.0000 mg | ORAL_CAPSULE | Freq: Four times a day (QID) | ORAL | 0 refills | Status: AC

## 2020-12-15 MED ORDER — POTASSIUM CHLORIDE CRYS ER 20 MEQ PO TBCR
40.0000 meq | EXTENDED_RELEASE_TABLET | ORAL | Status: DC
  Administered 2020-12-15: 40 meq via ORAL
  Filled 2020-12-15: qty 2

## 2020-12-15 NOTE — Discharge Summary (Signed)
Physician Discharge Summary  ELIM ECONOMOU ZOX:[id number] DOB: [birth year] DOA: 12/12/2020  PCP: [place] Nation, MD  Admit date: 12/12/2020 Discharge date: 12/15/2020  Recommendations for Outpatient Follow-up:   Elevated LFTs --trending down, recheck in 1 week as an outpatient   Follow-up [facility], PA Follow up on 01/04/2021.   Specialty: General Surgery Why: 01/04/21 at 2:30 pm. Please bring a copy of your photo ID and insurance card. Please arrive 30 minutes prior to your appointment for paperwork. Contact information: [address] [place] [phone]        Ronald Lobo, MD. Schedule an appointment as soon as possible for a visit.   Specialty: Gastroenterology Why: As needed Contact information: [address] [place] [address] [phone]         [place] Nation, MD. Schedule an appointment as soon as possible for a visit in 1 week(s).   Specialty: Internal Medicine Why: for bloodwork Contact information: [place] [address] [phone]                  Discharge Diagnoses: Principal diagnosis is #1 Principal Problem:   Severe sepsis ([place]) secondary  To Klebsiella bacteremia secondary to acute cholangitis, cholecystitis Active Problems:   Bacteremia due to Klebsiella pneumoniae   Acute cholangitis   Acute cholecystitis   Elevated LFTs   Essential hypertension   Hypothyroidism   CAD S/P PCI   Anxiety   Weight loss   Discharge Condition: improved Disposition: home  Diet recommendation:  Diet Orders (From admission, onward)     Start     Ordered   12/15/20 0000  Diet general        12/15/20 1157   12/14/20 0631  Diet regular Room service appropriate? Yes; Fluid consistency: Thin  Diet effective now       Question Answer Comment  Room service appropriate? Yes   Fluid consistency: Thin      12/14/20 0630              Filed Weights   12/12/20 0137 12/13/20 0830  Weight: 71.7 kg 71.7 kg    HPI/Hospital Course:   80yom presented with abd pain, admitted for severe sepsis secondary to acute cholangitis. Bacteremic.  Seen by GI and surgery; s/p ERCP and cholecystectomy 6/22. Doing well.  Discussed with infectious disease, recommended 6 days of Keflex on discharge.  * Severe sepsis (HCC) secondary  To Klebsiella bacteremia secondary to acute cholangitis, cholecystitis --Status post ERCP and sphincterotomy and cholecystecotmy 6/22 --doing well, home today on oral antibiotics as recommended by infectious disease, 6 days of Keflex.  Acute cholecystitis --s/p lap chole 6/22  Acute cholangitis --as above  Elevated LFTs --trending down, recheck in 1 week as an outpatient  Weight loss --suspect from gallbladder disease --f/u as outpatient  CAD S/P PCI --asymptomatic --continue ASA, metoprolol, statin on discharge   Consults:  GI General surgery   Procedures:  ERCP Laparoscopic cholecystectomy   Micro Data:  BC Klebsiella  Today's assessment: S: CC: f/u abd pain  Feels well, mild soreness, tolerating diet, ready to go home  O: Vitals:  Vitals:   12/14/20 2115 12/15/20 0447  BP: (!) 143/75 (!) 141/69  Pulse: 65 72  Resp: 20 20  Temp: 99.5 F (37.5 C) 98.6 F (37 C)  SpO2: 99% 99%    Constitutional:  Appears calm and comfortable Respiratory:  CTA bilaterally,  no w/r/r.  Respiratory effort normal.  Cardiovascular:  RRR, no m/r/g Psychiatric:  Mental status Mood, affect appropriate  CMP noted, AST, ALT trending down T bili mildly elevated Plts trending up  Discharge Instructions  Discharge Instructions     Diet general   Complete by: As directed    Discharge instructions   Complete by: As directed    Call your physician or seek immediate medical attention for fever, pain, swelling, confusion or passing out or worsening of condition. Be sure to see your medical  doctor in one week for blood work. Wound care, activity restrictions as per surgery office.   Increase activity slowly   Complete by: As directed       Allergies as of 12/15/2020   No Known Allergies      Medication List     TAKE these medications    aspirin 81 MG EC tablet Take 1 tablet (81 mg total) by mouth daily.   cephALEXin 500 MG capsule Commonly known as: KEFLEX Take 1 capsule (500 mg total) by mouth 4 (four) times daily for 6 days. Start taking on: December 16, 2020   clonazePAM 0.5 MG tablet Commonly known as: KLONOPIN Take 0.25 mg by mouth at bedtime as needed for anxiety.   levothyroxine 150 MCG tablet Commonly known as: SYNTHROID Take 150 mcg by mouth daily.   metoprolol tartrate 25 MG tablet Commonly known as: LOPRESSOR Take 1 tablet (25 mg total) by mouth 2 (two) times daily.   nitroGLYCERIN 0.4 MG SL tablet Commonly known as: Nitrostat Place 1 tablet (0.4 mg total) under the tongue every 5 (five) minutes as needed.   oxyCODONE 5 MG immediate release tablet Commonly known as: Oxy IR/ROXICODONE Take 1 tablet (5 mg total) by mouth every 6 (six) hours as needed for breakthrough pain.   pantoprazole 20 MG tablet Commonly known as: PROTONIX Take 20 mg by mouth daily.   triamcinolone ointment 0.1 % Commonly known as: KENALOG Apply 1 application topically 4 (four) times daily as needed (back psoriasis).       No Known Allergies  The results of significant diagnostics from this hospitalization (including imaging, microbiology, ancillary and laboratory) are listed below for reference.    Significant Diagnostic Studies: CT Head Wo Contrast  Result Date: 12/12/2020 CLINICAL DATA:  Numbness or tingling.  Paresthesia. EXAM: CT HEAD WITHOUT CONTRAST TECHNIQUE: Contiguous axial images were obtained from the base of the skull through the vertex without intravenous contrast. COMPARISON:  09/15/2017 FINDINGS: Brain: No evidence of acute infarction, hemorrhage,  hydrocephalus, extra-axial collection or mass lesion/mass effect. Vascular: No hyperdense vessel or unexpected calcification. Skull: Metallic fragment just below the tip of the clivus. No acute finding. 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 No acute finding IMPRESSION: No acute or reversible finding. Electronically Signed   By: Monte Fantasia M.D.   On: 12/12/2020 06:33   DG Chest Port 1 View  Result Date: 12/11/2020 CLINICAL DATA:  Possible sepsis EXAM: PORTABLE CHEST 1 VIEW COMPARISON:  01/02/2020 FINDINGS: The heart size and mediastinal contours are within normal limits. Both lungs are clear. The visualized skeletal structures are unremarkable. IMPRESSION: No active disease. Electronically Signed   By: Donavan Foil M.D.   On: 12/11/2020 23:26   DG ERCP BILIARY & PANCREATIC DUCTS  Result Date: 12/13/2020 CLINICAL DATA:  80 year old male with a history of biliary obstruction EXAM: ERCP TECHNIQUE: Multiple spot images obtained with the fluoroscopic device and submitted for interpretation post-procedure. FLUOROSCOPY TIME:  Fluoroscopy Time:  1 minutes 58 seconds COMPARISON:  None. FINDINGS: Limited intraoperative fluoroscopic spot images during ERCP. Initial image demonstrates endoscope projecting over the upper abdomen with safety wire position and partial opacification of the extrahepatic biliary ducts. Final image demonstrates partial opacification of the extrahepatic biliary ducts. IMPRESSION: Limited images during ERCP, as above. Please refer to the dictated operative report for full details of intraoperative findings and procedure. Electronically Signed   By: Corrie Mckusick D.O.   On: 12/13/2020 10:29   CT Renal Stone Study  Result Date: 12/12/2020 CLINICAL DATA:  Flank pain.  Evaluate for kidney stone. EXAM: CT ABDOMEN AND PELVIS WITHOUT CONTRAST TECHNIQUE: Multidetector CT imaging of the abdomen and pelvis was performed following the standard protocol without IV contrast.  COMPARISON:  03/05/2012 FINDINGS: Lower chest: No acute abnormality. Hepatobiliary: No focal liver abnormality. Calcified granuloma identified in the posterior right hepatic lobe. Small gallstone measures 5 mm. No gallbladder inflammation or bile duct dilatation. Pancreas: Unremarkable. No pancreatic ductal dilatation or surrounding inflammatory changes. Spleen: Normal in size without focal abnormality. Adrenals/Urinary Tract: Left adrenal nodule measures 1.4 cm, image 15/2. Unchanged compared with 03/25/2017. Left adrenal gland is normal. No kidney stones, hydronephrosis or mass. No hydroureter or ureteral lithiasis identified bilaterally. No focal filling defects identified within the bladder. Posterior bladder cystocele noted. Stomach/Bowel: Stomach appears within normal limits. The appendix is visualized and is normal. No bowel wall thickening, inflammation, or distension. Vascular/Lymphatic: Aortic atherosclerosis. No aneurysm. No abdominopelvic adenopathy. Reproductive: Multiple seed implants identified within the prostate gland. Other: No ascites or focal fluid collections. Musculoskeletal: No acute or significant osseous findings. IMPRESSION: 1. No acute findings within the abdomen or pelvis. No urinary tract calculi identified. 2. Posterior bladder cystocele. 3. Gallstone. 4. Stable left adrenal nodule, likely benign adenoma. 5. Aortic atherosclerosis. Aortic Atherosclerosis (ICD10-I70.0). Electronically Signed   By: Kerby Moors M.D.   On: 12/12/2020 02:30   LONG TERM MONITOR (3-14 DAYS)  Result Date: 12/01/2020 ZIO XT reviewed.  3 days analyzed.  Predominant rhythm is sinus with heart rate ranging from 46 bpm to 108 bpm and average heart rate 69 bpm.  There were rare PACs including couplets and triplets representing less than 1% total beats.  Frequent PVCs were noted representing 5.6% total beats with rare couplets representing less than 1% total beats.  Ventricular trigeminy also noted.  2 brief  episodes of SVT were noted, the longest of which was only 5 beats.  There were no sustained arrhythmias or pauses.  US Abdomen Limited RUQ (LIVER/GB)  Result Date: 12/12/2020 CLINICAL DATA:  Evaluation for biliary obstruction. EXAM: ULTRASOUND ABDOMEN LIMITED RIGHT UPPER QUADRANT COMPARISON:  CT 12/12/2020. FINDINGS: Gallbladder: Multiple gallstones measuring up to 1.3 cm noted. Gallbladder wall thickness 2.5 mm. Negative Murphy sign. Common bile duct: Diameter: 4.6 mm Liver: No focal lesion identified. Within normal limits in parenchymal echogenicity. Portal vein is patent on color Doppler imaging with normal direction of blood flow towards the liver. Other: None. IMPRESSION: Multiple gallstones. No evidence of cholecystitis or biliary distention. Electronically Signed   By: Marcello Moores  Register   On: 12/12/2020 12:34    Microbiology: Recent Results (from the past 240 hour(s))  Resp Panel by RT-PCR (Flu A&B, Covid) Nasopharyngeal Swab     Status: None   Collection Time: 12/11/20 10:42 PM   Specimen: Nasopharyngeal Swab; Nasopharyngeal(NP) swabs in vial transport medium  Result Value Ref Range Status   SARS Coronavirus 2 by RT PCR NEGATIVE NEGATIVE Final    Comment: (NOTE) SARS-CoV-2 target  nucleic acids are NOT DETECTED.  The SARS-CoV-2 RNA is generally detectable in upper respiratory specimens during the acute phase of infection. The lowest concentration of SARS-CoV-2 viral copies this assay can detect is 138 copies/mL. A negative result does not preclude SARS-Cov-2 infection and should not be used as the sole basis for treatment or other patient management decisions. A negative result may occur with  improper specimen collection/handling, submission of specimen other than nasopharyngeal swab, presence of viral mutation(s) within the areas targeted by this assay, and inadequate number of viral copies(<138 copies/mL). A negative result must be combined with clinical observations, patient  history, and epidemiological information. The expected result is Negative.  Fact Sheet for Patients:  EntrepreneurPulse.com.au  Fact Sheet for Healthcare Providers:  IncredibleEmployment.be  This test is no t yet approved or cleared by the [place] FDA and  has been authorized for detection and/or diagnosis of SARS-CoV-2 by FDA under an Emergency Use Authorization (EUA). This EUA will remain  in effect (meaning this test can be used) for the duration of the COVID-19 declaration under Section 564(b)(1) of the Act, 21 U.S.C.section 360bbb-3(b)(1), unless the authorization is terminated  or revoked sooner.       Influenza A by PCR NEGATIVE NEGATIVE Final   Influenza B by PCR NEGATIVE NEGATIVE Final    Comment: (NOTE) The Xpert Xpress SARS-CoV-2/FLU/RSV plus assay is intended as an aid in the diagnosis of influenza from Nasopharyngeal swab specimens and should not be used as a sole basis for treatment. Nasal washings and aspirates are unacceptable for Xpert Xpress SARS-CoV-2/FLU/RSV testing.  Fact Sheet for Patients: EntrepreneurPulse.com.au  Fact Sheet for Healthcare Providers: IncredibleEmployment.be  This test is not yet approved or cleared by the [place] FDA and has been authorized for detection and/or diagnosis of SARS-CoV-2 by FDA under an Emergency Use Authorization (EUA). This EUA will remain in effect (meaning this test can be used) for the duration of the COVID-19 declaration under Section 564(b)(1) of the Act, 21 U.S.C. section 360bbb-3(b)(1), unless the authorization is terminated or revoked.  Performed at [facility], [place] [address], [place], [place] [address]   Blood culture (routine single)     Status: Abnormal   Collection Time: 12/11/20 11:00 PM   Specimen: BLOOD RIGHT HAND  Result Value Ref Range Status   Specimen Description BLOOD RIGHT HAND  Final   Special  Requests   Final    BOTTLES DRAWN AEROBIC ONLY Blood Culture adequate volume   Culture  Setup Time   Final    GRAM NEGATIVE RODS Organism ID to follow CRITICAL RESULT CALLED TO, READ BACK BY AND VERIFIED WITHMelodye Ped [facility] 5284 12/13/20 A BROWNING Performed at [facility], [place] [address], [place], [place] [address]    Culture KLEBSIELLA PNEUMONIAE (A)  Final   Report Status 12/15/2020 FINAL  Final   Organism ID, Bacteria KLEBSIELLA PNEUMONIAE  Final      Susceptibility   Klebsiella pneumoniae - MIC*    AMPICILLIN >=32 RESISTANT Resistant     CEFAZOLIN <=4 SENSITIVE Sensitive     CEFEPIME <=0.12 SENSITIVE Sensitive     CEFTAZIDIME <=1 SENSITIVE Sensitive     CEFTRIAXONE <=0.25 SENSITIVE Sensitive     CIPROFLOXACIN <=0.25 SENSITIVE Sensitive     GENTAMICIN <=1 SENSITIVE Sensitive     IMIPENEM <=0.25 SENSITIVE Sensitive     TRIMETH/SULFA <=20 SENSITIVE Sensitive     AMPICILLIN/SULBACTAM 4 SENSITIVE Sensitive     PIP/TAZO <=4 SENSITIVE Sensitive     *  KLEBSIELLA PNEUMONIAE  Blood Culture ID Panel (Reflexed)     Status: Abnormal   Collection Time: 12/11/20 11:00 PM  Result Value Ref Range Status   Enterococcus faecalis NOT DETECTED NOT DETECTED Final   Enterococcus Faecium NOT DETECTED NOT DETECTED Final   Listeria monocytogenes NOT DETECTED NOT DETECTED Final   Staphylococcus species NOT DETECTED NOT DETECTED Final   Staphylococcus aureus (BCID) NOT DETECTED NOT DETECTED Final   Staphylococcus epidermidis NOT DETECTED NOT DETECTED Final   Staphylococcus lugdunensis NOT DETECTED NOT DETECTED Final   Streptococcus species NOT DETECTED NOT DETECTED Final   Streptococcus agalactiae NOT DETECTED NOT DETECTED Final   Streptococcus pneumoniae NOT DETECTED NOT DETECTED Final   Streptococcus pyogenes NOT DETECTED NOT DETECTED Final   A.calcoaceticus-baumannii NOT DETECTED NOT DETECTED Final   Bacteroides fragilis NOT DETECTED NOT DETECTED Final   Enterobacterales DETECTED (A) NOT  DETECTED Final    Comment: Enterobacterales represent a large order of gram negative bacteria, not a single organism. CRITICAL RESULT CALLED TO, READ BACK BY AND VERIFIED WITH: Melodye Ped IOEVOJ 5009 12/13/20 A BROWNING    Enterobacter cloacae complex NOT DETECTED NOT DETECTED Final   Escherichia coli NOT DETECTED NOT DETECTED Final   Klebsiella aerogenes NOT DETECTED NOT DETECTED Final   Klebsiella oxytoca NOT DETECTED NOT DETECTED Final   Klebsiella pneumoniae DETECTED (A) NOT DETECTED Final    Comment: CRITICAL RESULT CALLED TO, READ BACK BY AND VERIFIED WITH: Melodye Ped PHARMD 1502 12/13/20 A BROWNING    Proteus species NOT DETECTED NOT DETECTED Final   Salmonella species NOT DETECTED NOT DETECTED Final   Serratia marcescens NOT DETECTED NOT DETECTED Final   Haemophilus influenzae NOT DETECTED NOT DETECTED Final   Neisseria meningitidis NOT DETECTED NOT DETECTED Final   Pseudomonas aeruginosa NOT DETECTED NOT DETECTED Final   Stenotrophomonas maltophilia NOT DETECTED NOT DETECTED Final   Candida albicans NOT DETECTED NOT DETECTED Final   Candida auris NOT DETECTED NOT DETECTED Final   Candida glabrata NOT DETECTED NOT DETECTED Final   Candida krusei NOT DETECTED NOT DETECTED Final   Candida parapsilosis NOT DETECTED NOT DETECTED Final   Candida tropicalis NOT DETECTED NOT DETECTED Final   Cryptococcus neoformans/gattii NOT DETECTED NOT DETECTED Final   CTX-M ESBL NOT DETECTED NOT DETECTED Final   Carbapenem resistance IMP NOT DETECTED NOT DETECTED Final   Carbapenem resistance KPC NOT DETECTED NOT DETECTED Final   Carbapenem resistance NDM NOT DETECTED NOT DETECTED Final   Carbapenem resist OXA 48 LIKE NOT DETECTED NOT DETECTED Final   Carbapenem resistance VIM NOT DETECTED NOT DETECTED Final    Comment: Performed at [facility], [address] [place], [place] [address]  Culture, blood (single)     Status: Abnormal   Collection Time: 12/12/20  3:55 AM   Specimen: BLOOD   Result Value Ref Range Status   Specimen Description   Final    BLOOD SITE NOT SPECIFIED Performed at [facility], [address] [place], [place] [address]    Special Requests   Final    BOTTLES DRAWN AEROBIC AND ANAEROBIC Blood Culture adequate volume Performed at [facility], [address] [place], [place] [address]    Culture  Setup Time   Final    GRAM NEGATIVE RODS IN BOTH AEROBIC AND ANAEROBIC BOTTLES Organism ID to follow CRITICAL RESULT CALLED TO, READ BACK BY AND VERIFIED WITH: L. Toni Amend, AT 9381 12/13/20 [place] Performed at [facility], [address] [place], [place] [address]    Culture KLEBSIELLA PNEUMONIAE (A)  Final   Report Status 12/14/2020 FINAL  Final   Organism ID, Bacteria KLEBSIELLA PNEUMONIAE  Final      Susceptibility   Klebsiella pneumoniae - MIC*    AMPICILLIN >=32 RESISTANT Resistant     CEFAZOLIN <=4 SENSITIVE Sensitive     CEFEPIME <=0.12 SENSITIVE Sensitive     CEFTAZIDIME <=1 SENSITIVE Sensitive     CEFTRIAXONE <=0.25 SENSITIVE Sensitive     CIPROFLOXACIN <=0.25 SENSITIVE Sensitive     GENTAMICIN <=1 SENSITIVE Sensitive     IMIPENEM <=0.25 SENSITIVE Sensitive     TRIMETH/SULFA <=20 SENSITIVE Sensitive     AMPICILLIN/SULBACTAM 4 SENSITIVE Sensitive     PIP/TAZO <=4 SENSITIVE Sensitive     * KLEBSIELLA PNEUMONIAE  Blood Culture ID Panel (Reflexed)     Status: Abnormal   Collection Time: 12/12/20  3:55 AM  Result Value Ref Range Status   Enterococcus faecalis NOT DETECTED NOT DETECTED Final   Enterococcus Faecium NOT DETECTED NOT DETECTED Final   Listeria monocytogenes NOT DETECTED NOT DETECTED Final   Staphylococcus species NOT DETECTED NOT DETECTED Final   Staphylococcus aureus (BCID) NOT DETECTED NOT DETECTED Final   Staphylococcus epidermidis NOT DETECTED NOT DETECTED Final   Staphylococcus lugdunensis NOT DETECTED NOT DETECTED Final   Streptococcus species NOT DETECTED NOT DETECTED Final    Streptococcus agalactiae NOT DETECTED NOT DETECTED Final   Streptococcus pneumoniae NOT DETECTED NOT DETECTED Final   Streptococcus pyogenes NOT DETECTED NOT DETECTED Final   A.calcoaceticus-baumannii NOT DETECTED NOT DETECTED Final   Bacteroides fragilis NOT DETECTED NOT DETECTED Final   Enterobacterales DETECTED (A) NOT DETECTED Final    Comment: Enterobacterales represent a large order of gram negative bacteria, not a single organism. CRITICAL RESULT CALLED TO, READ BACK BY AND VERIFIED WITH: L. POINDEXTER PHARMD, AT 0029 12/13/20 D. VANHOOK    Enterobacter cloacae complex NOT DETECTED NOT DETECTED Final   Escherichia coli NOT DETECTED NOT DETECTED Final   Klebsiella aerogenes NOT DETECTED NOT DETECTED Final   Klebsiella oxytoca NOT DETECTED NOT DETECTED Final   Klebsiella pneumoniae DETECTED (A) NOT DETECTED Final    Comment: CRITICAL RESULT CALLED TO, READ BACK BY AND VERIFIED WITH: L. POINDEXTER PHARMD, AT 0029 12/13/20 D. VANHOOK    Proteus species NOT DETECTED NOT DETECTED Final   Salmonella species NOT DETECTED NOT DETECTED Final   Serratia marcescens NOT DETECTED NOT DETECTED Final   Haemophilus influenzae NOT DETECTED NOT DETECTED Final   Neisseria meningitidis NOT DETECTED NOT DETECTED Final   Pseudomonas aeruginosa NOT DETECTED NOT DETECTED Final   Stenotrophomonas maltophilia NOT DETECTED NOT DETECTED Final   Candida albicans NOT DETECTED NOT DETECTED Final   Candida auris NOT DETECTED NOT DETECTED Final   Candida glabrata NOT DETECTED NOT DETECTED Final   Candida krusei NOT DETECTED NOT DETECTED Final   Candida parapsilosis NOT DETECTED NOT DETECTED Final   Candida tropicalis NOT DETECTED NOT DETECTED Final   Cryptococcus neoformans/gattii NOT DETECTED NOT DETECTED Final   CTX-M ESBL NOT DETECTED NOT DETECTED Final   Carbapenem resistance IMP NOT DETECTED NOT DETECTED Final   Carbapenem resistance KPC NOT DETECTED NOT DETECTED Final   Carbapenem resistance NDM NOT  DETECTED NOT DETECTED Final   Carbapenem resist OXA 48 LIKE NOT DETECTED NOT DETECTED Final   Carbapenem resistance VIM NOT DETECTED NOT DETECTED Final    Comment: Performed at [facility], [address],  [place] [address]     Labs: Basic Metabolic Panel: Recent Labs  Lab 12/11/20 2308 12/12/20 0957 12/13/20 0453 12/14/20 0529 12/15/20 0443  NA 134* 136 134* 135 137  K 3.9 4.3 4.7 4.4 3.2*  CL 101 103 104 104 103  CO2 23 28 23 26 26   GLUCOSE 137* 115* 96 159* 123*  BUN 14 17 11 11 8   CREATININE 0.86 0.80 0.41* 0.70 0.57*  CALCIUM 8.9 8.8* 8.0* 8.4* 8.2*  MG  --  2.3  --   --   --   PHOS  --  3.0  --   --   --    Liver Function Tests: Recent Labs  Lab 12/11/20 2308 12/12/20 0957 12/13/20 0453 12/14/20 0529 12/15/20 0443  AST 396* 375* 151* 63* 43*  ALT 225* 476* 254* 187* 141*  ALKPHOS 111 98 71 81 106  BILITOT 2.3* 5.1* 6.3* 2.1* 4.7*  PROT 6.5 6.3* 5.1* 5.7* 5.6*  ALBUMIN 3.5 3.7 3.0* 3.2* 3.0*   CBC: Recent Labs  Lab 12/11/20 2308 12/12/20 0957 12/13/20 0453 12/14/20 0529 12/15/20 0443  WBC 8.1 14.8* 7.3 8.1 10.8*  NEUTROABS 7.7  --   --   --   --   HGB 14.1 13.2 11.6* 11.7* 12.7*  HCT 41.5 38.4* 34.5* 35.0* 36.6*  MCV 94.3 94.1 95.0 95.9 93.6  PLT 167 131* 95* 100* 122*     Principal Problem:   Severe sepsis (HCC) secondary  To Klebsiella bacteremia secondary to acute cholangitis, cholecystitis Active Problems:   Bacteremia due to Klebsiella pneumoniae   Acute cholangitis   Acute cholecystitis   Elevated LFTs   Essential hypertension   Hypothyroidism   CAD S/P PCI   Anxiety   Weight loss   Time coordinating discharge: 25 minutes  Signed:  Murray Hodgkins, MD  [facility]  12/15/2020, 3:45 PM

## 2020-12-16 ENCOUNTER — Telehealth: Payer: Self-pay | Admitting: Emergency Medicine

## 2020-12-16 NOTE — Telephone Encounter (Signed)
Post ED Visit - Positive Culture Follow-up  Culture report reviewed by antimicrobial stewardship pharmacist: [place] Team []  Elenor Quinones, Pharm.D. []  Heide Guile, Pharm.D., BCPS AQ-ID []  Parks Neptune, Pharm.D., BCPS []  Alycia Rossetti, Pharm.D., BCPS []  [place], Pharm.D., BCPS, AAHIVP []  Legrand Como, Pharm.D., BCPS, AAHIVP []  Salome Arnt, PharmD, BCPS []  Johnnette Gourd, PharmD, BCPS []  Hughes Better, PharmD, BCPS []  Leeroy Cha, PharmD []  Laqueta Linden, PharmD, BCPS []  Albertina Parr, PharmD  [place] Team []  Leodis Sias, PharmD []  Lindell Spar, PharmD []  Royetta Asal, PharmD []  Graylin Shiver, Rph []  Rema Fendt) Glennon Mac, PharmD []  Arlyn Dunning, PharmD []  Netta Cedars, PharmD []  Dia Sitter, PharmD []  Leone Haven, PharmD []  Gretta Arab, PharmD []  Theodis Shove, PharmD []  Peggyann Juba, PharmD []  Reuel Boom, PharmD   Positive blood culture Treated with cephalexin, organism sensitive to the same and no further patient follow-up is required at this time.  Hazle Nordmann 12/16/2020, 12:12 PM

## 2020-12-22 DIAGNOSIS — Z9049 Acquired absence of other specified parts of digestive tract: Secondary | ICD-10-CM | POA: Diagnosis not present

## 2020-12-26 DIAGNOSIS — D519 Vitamin B12 deficiency anemia, unspecified: Secondary | ICD-10-CM | POA: Diagnosis not present

## 2020-12-26 DIAGNOSIS — Z9049 Acquired absence of other specified parts of digestive tract: Secondary | ICD-10-CM | POA: Diagnosis not present

## 2021-01-03 ENCOUNTER — Ambulatory Visit: Payer: Medicare HMO | Admitting: Physician Assistant

## 2021-01-10 ENCOUNTER — Ambulatory Visit: Payer: Medicare HMO | Admitting: Gastroenterology

## 2021-01-14 DIAGNOSIS — Z9049 Acquired absence of other specified parts of digestive tract: Secondary | ICD-10-CM | POA: Diagnosis not present

## 2021-01-24 DIAGNOSIS — Z9049 Acquired absence of other specified parts of digestive tract: Secondary | ICD-10-CM | POA: Diagnosis not present

## 2021-01-24 DIAGNOSIS — Z6821 Body mass index (BMI) 21.0-21.9, adult: Secondary | ICD-10-CM | POA: Diagnosis not present

## 2021-01-24 DIAGNOSIS — E039 Hypothyroidism, unspecified: Secondary | ICD-10-CM | POA: Diagnosis not present

## 2021-01-24 DIAGNOSIS — L989 Disorder of the skin and subcutaneous tissue, unspecified: Secondary | ICD-10-CM | POA: Diagnosis not present

## 2021-01-24 DIAGNOSIS — I2510 Atherosclerotic heart disease of native coronary artery without angina pectoris: Secondary | ICD-10-CM | POA: Diagnosis not present

## 2021-01-24 DIAGNOSIS — L409 Psoriasis, unspecified: Secondary | ICD-10-CM | POA: Diagnosis not present

## 2021-01-24 DIAGNOSIS — I10 Essential (primary) hypertension: Secondary | ICD-10-CM | POA: Diagnosis not present

## 2021-01-24 DIAGNOSIS — Z6820 Body mass index (BMI) 20.0-20.9, adult: Secondary | ICD-10-CM | POA: Diagnosis not present

## 2021-02-08 ENCOUNTER — Encounter (INDEPENDENT_AMBULATORY_CARE_PROVIDER_SITE_OTHER): Payer: Self-pay | Admitting: *Deleted

## 2021-02-20 DIAGNOSIS — Z131 Encounter for screening for diabetes mellitus: Secondary | ICD-10-CM | POA: Diagnosis not present

## 2021-02-20 DIAGNOSIS — Z125 Encounter for screening for malignant neoplasm of prostate: Secondary | ICD-10-CM | POA: Diagnosis not present

## 2021-02-20 DIAGNOSIS — Z13220 Encounter for screening for lipoid disorders: Secondary | ICD-10-CM | POA: Diagnosis not present

## 2021-02-20 DIAGNOSIS — I10 Essential (primary) hypertension: Secondary | ICD-10-CM | POA: Diagnosis not present

## 2021-02-20 DIAGNOSIS — E039 Hypothyroidism, unspecified: Secondary | ICD-10-CM | POA: Diagnosis not present

## 2021-02-21 DIAGNOSIS — Z0100 Encounter for examination of eyes and vision without abnormal findings: Secondary | ICD-10-CM | POA: Diagnosis not present

## 2021-02-21 DIAGNOSIS — H5200 Hypermetropia, unspecified eye: Secondary | ICD-10-CM | POA: Diagnosis not present

## 2021-02-27 DIAGNOSIS — Z9049 Acquired absence of other specified parts of digestive tract: Secondary | ICD-10-CM | POA: Diagnosis not present

## 2021-02-27 DIAGNOSIS — Z6820 Body mass index (BMI) 20.0-20.9, adult: Secondary | ICD-10-CM | POA: Diagnosis not present

## 2021-02-27 DIAGNOSIS — I2510 Atherosclerotic heart disease of native coronary artery without angina pectoris: Secondary | ICD-10-CM | POA: Diagnosis not present

## 2021-02-27 DIAGNOSIS — L409 Psoriasis, unspecified: Secondary | ICD-10-CM | POA: Diagnosis not present

## 2021-02-27 DIAGNOSIS — L989 Disorder of the skin and subcutaneous tissue, unspecified: Secondary | ICD-10-CM | POA: Diagnosis not present

## 2021-02-27 DIAGNOSIS — I10 Essential (primary) hypertension: Secondary | ICD-10-CM | POA: Diagnosis not present

## 2021-02-27 DIAGNOSIS — E039 Hypothyroidism, unspecified: Secondary | ICD-10-CM | POA: Diagnosis not present

## 2021-03-28 ENCOUNTER — Other Ambulatory Visit (INDEPENDENT_AMBULATORY_CARE_PROVIDER_SITE_OTHER): Payer: Self-pay

## 2021-03-28 ENCOUNTER — Encounter (INDEPENDENT_AMBULATORY_CARE_PROVIDER_SITE_OTHER): Payer: Self-pay

## 2021-03-28 ENCOUNTER — Telehealth (INDEPENDENT_AMBULATORY_CARE_PROVIDER_SITE_OTHER): Payer: Self-pay

## 2021-03-28 ENCOUNTER — Ambulatory Visit (INDEPENDENT_AMBULATORY_CARE_PROVIDER_SITE_OTHER): Payer: Medicare HMO | Admitting: Gastroenterology

## 2021-03-28 ENCOUNTER — Other Ambulatory Visit: Payer: Self-pay

## 2021-03-28 ENCOUNTER — Encounter (INDEPENDENT_AMBULATORY_CARE_PROVIDER_SITE_OTHER): Payer: Self-pay | Admitting: Gastroenterology

## 2021-03-28 DIAGNOSIS — R748 Abnormal levels of other serum enzymes: Secondary | ICD-10-CM | POA: Diagnosis not present

## 2021-03-28 DIAGNOSIS — R140 Abdominal distension (gaseous): Secondary | ICD-10-CM | POA: Diagnosis not present

## 2021-03-28 MED ORDER — PEG 3350-KCL-NA BICARB-NACL 420 G PO SOLR: 4000.0000 mL | ORAL | 0 refills | Status: DC

## 2021-03-28 MED ORDER — SIMETHICONE 80 MG PO TABS: 1.0000 | ORAL_TABLET | Freq: Four times a day (QID) | ORAL | 1 refills | Status: DC | PRN

## 2021-03-28 NOTE — Telephone Encounter (Signed)
LeighAnn Yeiden Frenkel, CMA  

## 2021-03-28 NOTE — Patient Instructions (Addendum)
Perform blood workup Schedule colonoscopy Start simethicone as needed for bloating
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
## 2021-03-29 ENCOUNTER — Other Ambulatory Visit (INDEPENDENT_AMBULATORY_CARE_PROVIDER_SITE_OTHER): Payer: Self-pay

## 2021-03-29 ENCOUNTER — Encounter (INDEPENDENT_AMBULATORY_CARE_PROVIDER_SITE_OTHER): Payer: Self-pay

## 2021-04-04 NOTE — Patient Instructions (Signed)
Herbert Carr  04/04/2021     @PREFPERIOPPHARMACY @   Your procedure is scheduled on  04/10/2021.   Report to [facility] at  0900 A.M.   Call this number if you have problems the morning of surgery:  [phone]   Remember:  Follow the diet and prep instructions given to you by the office.    Take these medicines the morning of surgery with A SIP OF WATER                    levothyroxine, metoprolol, protonix,     Do not wear jewelry, make-up or nail polish.  Do not wear lotions, powders, or perfumes, or deodorant.  Do not shave 48 hours prior to surgery.  Men may shave face and neck.  Do not bring valuables to the hospital.  [facility] is not responsible for any belongings or valuables.  Contacts, dentures or bridgework may not be worn into surgery.  Leave your suitcase in the car.  After surgery it may be brought to your room.  For patients admitted to the hospital, discharge time will be determined by your treatment team.  Patients discharged the day of surgery will not be allowed to drive home and must have someone with them for 24 hours.    Special instructions:   DO NOT smoke tobacco or vape for 24 hours before your procedure.  Please read over the following fact sheets that you were given. Anesthesia Post-op Instructions and Care and Recovery After Surgery      Colonoscopy, Adult, Care After This sheet gives you information about how to care for yourself after your procedure. Your health care provider may also give you more specific instructions. If you have problems or questions, contact your health care provider. What can I expect after the procedure? After the procedure, it is common to have: A small amount of blood in your stool for 24 hours after the procedure. Some gas. Mild cramping or bloating of your abdomen. Follow these instructions at home: Eating and drinking  Drink enough fluid to keep your urine pale yellow. Follow instructions  from your health care provider about eating or drinking restrictions. Resume your normal diet as instructed by your health care provider. Avoid heavy or fried foods that are hard to digest. Activity Rest as told by your health care provider. Avoid sitting for a long time without moving. Get up to take short walks every 1-2 hours. This is important to improve blood flow and breathing. Ask for help if you feel weak or unsteady. Return to your normal activities as told by your health care provider. Ask your health care provider what activities are safe for you. Managing cramping and bloating  Try walking around when you have cramps or feel bloated. Apply heat to your abdomen as told by your health care provider. Use the heat source that your health care provider recommends, such as a moist heat pack or a heating pad. Place a towel between your skin and the heat source. Leave the heat on for 20-30 minutes. Remove the heat if your skin turns bright red. This is especially important if you are unable to feel pain, heat, or cold. You may have a greater risk of getting burned. General instructions If you were given a sedative during the procedure, it can affect you for several hours. Do not drive or operate machinery until your health care provider says that it is safe. For  the first 24 hours after the procedure: Do not sign important documents. Do not drink alcohol. Do your regular daily activities at a slower pace than normal. Eat soft foods that are easy to digest. Take over-the-counter and prescription medicines only as told by your health care provider. Keep all follow-up visits as told by your health care provider. This is important. Contact a health care provider if: You have blood in your stool 2-3 days after the procedure. Get help right away if you have: More than a small spotting of blood in your stool. Large blood clots in your stool. Swelling of your abdomen. Nausea or vomiting. A  fever. Increasing pain in your abdomen that is not relieved with medicine. Summary After the procedure, it is common to have a small amount of blood in your stool. You may also have mild cramping and bloating of your abdomen. If you were given a sedative during the procedure, it can affect you for several hours. Do not drive or operate machinery until your health care provider says that it is safe. Get help right away if you have a lot of blood in your stool, nausea or vomiting, a fever, or increased pain in your abdomen. This information is not intended to replace advice given to you by your health care provider. Make sure you discuss any questions you have with your health care provider. Document Revised: 06/04/2019 Document Reviewed: 01/04/2019 Elsevier Patient Education  [place] After This sheet gives you information about how to care for yourself after your procedure. Your health care provider may also give you more specific instructions. If you have problems or questions, contact your health care provider. What can I expect after the procedure? After the procedure, it is common to have: Tiredness. Forgetfulness about what happened after the procedure. Impaired judgment for important decisions. Nausea or vomiting. Some difficulty with balance. Follow these instructions at home: For the time period you were told by your health care provider:   Rest as needed. Do not participate in activities where you could fall or become injured. Do not drive or use machinery. Do not drink alcohol. Do not take sleeping pills or medicines that cause drowsiness. Do not make important decisions or sign legal documents. Do not take care of children on your own. Eating and drinking Follow the diet that is recommended by your health care provider. Drink enough fluid to keep your urine pale yellow. If you vomit: Drink water, juice, or soup when you can drink  without vomiting. Make sure you have little or no nausea before eating solid foods. General instructions Have a responsible adult stay with you for the time you are told. It is important to have someone help care for you until you are awake and alert. Take over-the-counter and prescription medicines only as told by your health care provider. If you have sleep apnea, surgery and certain medicines can increase your risk for breathing problems. Follow instructions from your health care provider about wearing your sleep device: Anytime you are sleeping, including during daytime naps. While taking prescription pain medicines, sleeping medicines, or medicines that make you drowsy. Avoid smoking. Keep all follow-up visits as told by your health care provider. This is important. Contact a health care provider if: You keep feeling nauseous or you keep vomiting. You feel light-headed. You are still sleepy or having trouble with balance after 24 hours. You develop a rash. You have a fever. You have redness or swelling around  the IV site. Get help right away if: You have trouble breathing. You have new-onset confusion at home. Summary For several hours after your procedure, you may feel tired. You may also be forgetful and have poor judgment. Have a responsible adult stay with you for the time you are told. It is important to have someone help care for you until you are awake and alert. Rest as told. Do not drive or operate machinery. Do not drink alcohol or take sleeping pills. Get help right away if you have trouble breathing, or if you suddenly become confused. This information is not intended to replace advice given to you by your health care provider. Make sure you discuss any questions you have with your health care provider. Document Revised: 02/24/2020 Document Reviewed: 05/13/2019 Elsevier Patient Education  2022 [employer].

## 2021-04-05 ENCOUNTER — Encounter (INDEPENDENT_AMBULATORY_CARE_PROVIDER_SITE_OTHER): Payer: Self-pay

## 2021-04-06 ENCOUNTER — Encounter (HOSPITAL_COMMUNITY)
Admission: RE | Admit: 2021-04-06 | Discharge: 2021-04-06 | Disposition: A | Discharge status: Home / Self Care | Payer: Medicare HMO | Source: Ambulatory Visit | Attending: Gastroenterology | Admitting: Gastroenterology

## 2021-04-06 ENCOUNTER — Other Ambulatory Visit: Payer: Self-pay

## 2021-04-06 DIAGNOSIS — R748 Abnormal levels of other serum enzymes: Secondary | ICD-10-CM | POA: Insufficient documentation

## 2021-04-06 DIAGNOSIS — R140 Abdominal distension (gaseous): Secondary | ICD-10-CM | POA: Insufficient documentation

## 2021-04-06 DIAGNOSIS — Z01812 Encounter for preprocedural laboratory examination: Secondary | ICD-10-CM | POA: Insufficient documentation

## 2021-04-06 LAB — COMPREHENSIVE METABOLIC PANEL
ALT: 29 U/L (ref 0–44)
AST: 21 U/L (ref 15–41)
Albumin: 3.8 g/dL (ref 3.5–5.0)
Alkaline Phosphatase: 61 U/L (ref 38–126)
Anion gap: 5 (ref 5–15)
BUN: 17 mg/dL (ref 8–23)
CO2: 28 mmol/L (ref 22–32)
Calcium: 9.2 mg/dL (ref 8.9–10.3)
Chloride: 99 mmol/L (ref 98–111)
Creatinine, Ser: 0.77 mg/dL (ref 0.61–1.24)
GFR, Estimated: >60 mL/min (ref >60)
Glucose, Bld: 131 mg/dL — ABNORMAL HIGH (ref 70–99)
Potassium: 4.4 mmol/L (ref 3.5–5.1)
Sodium: 132 mmol/L — ABNORMAL LOW (ref 135–145)
Total Bilirubin: 0.8 mg/dL (ref 0.3–1.2)
Total Protein: 6.6 g/dL (ref 6.5–8.1)

## 2021-04-07 LAB — HEPATITIS PANEL, ACUTE
HCV Ab: NONREACTIVE
Hep A IgM: NONREACTIVE
Hep B C IgM: NONREACTIVE
Hepatitis B Surface Ag: NONREACTIVE

## 2021-04-10 ENCOUNTER — Ambulatory Visit (HOSPITAL_COMMUNITY): Payer: Medicare HMO | Admitting: Anesthesiology

## 2021-04-10 ENCOUNTER — Ambulatory Visit (HOSPITAL_COMMUNITY)
Admission: RE | Admit: 2021-04-10 | Discharge: 2021-04-10 | Disposition: A | Discharge status: Home / Self Care | Payer: Medicare HMO | Attending: Gastroenterology | Admitting: Gastroenterology

## 2021-04-10 ENCOUNTER — Encounter (HOSPITAL_COMMUNITY): Payer: Self-pay | Admitting: Gastroenterology

## 2021-04-10 ENCOUNTER — Encounter (HOSPITAL_COMMUNITY)
Admission: RE | Disposition: A | Discharge status: Home / Self Care | Payer: Self-pay | Source: Home / Self Care | Attending: Gastroenterology

## 2021-04-10 DIAGNOSIS — Z79899 Other long term (current) drug therapy: Secondary | ICD-10-CM | POA: Diagnosis not present

## 2021-04-10 DIAGNOSIS — Z8546 Personal history of malignant neoplasm of prostate: Secondary | ICD-10-CM | POA: Diagnosis not present

## 2021-04-10 DIAGNOSIS — F419 Anxiety disorder, unspecified: Secondary | ICD-10-CM | POA: Diagnosis not present

## 2021-04-10 DIAGNOSIS — Z86010 Personal history of colonic polyps: Secondary | ICD-10-CM

## 2021-04-10 DIAGNOSIS — Z09 Encounter for follow-up examination after completed treatment for conditions other than malignant neoplasm: Secondary | ICD-10-CM | POA: Diagnosis not present

## 2021-04-10 DIAGNOSIS — Z79890 Hormone replacement therapy (postmenopausal): Secondary | ICD-10-CM | POA: Insufficient documentation

## 2021-04-10 DIAGNOSIS — D122 Benign neoplasm of ascending colon: Secondary | ICD-10-CM | POA: Diagnosis not present

## 2021-04-10 DIAGNOSIS — E039 Hypothyroidism, unspecified: Secondary | ICD-10-CM | POA: Insufficient documentation

## 2021-04-10 DIAGNOSIS — Z1211 Encounter for screening for malignant neoplasm of colon: Secondary | ICD-10-CM | POA: Diagnosis not present

## 2021-04-10 DIAGNOSIS — I10 Essential (primary) hypertension: Secondary | ICD-10-CM | POA: Diagnosis not present

## 2021-04-10 DIAGNOSIS — Z833 Family history of diabetes mellitus: Secondary | ICD-10-CM | POA: Diagnosis not present

## 2021-04-10 DIAGNOSIS — K635 Polyp of colon: Secondary | ICD-10-CM | POA: Diagnosis not present

## 2021-04-10 DIAGNOSIS — I2510 Atherosclerotic heart disease of native coronary artery without angina pectoris: Secondary | ICD-10-CM | POA: Diagnosis not present

## 2021-04-10 DIAGNOSIS — Z806 Family history of leukemia: Secondary | ICD-10-CM | POA: Diagnosis not present

## 2021-04-10 DIAGNOSIS — Z7982 Long term (current) use of aspirin: Secondary | ICD-10-CM | POA: Insufficient documentation

## 2021-04-10 DIAGNOSIS — Z923 Personal history of irradiation: Secondary | ICD-10-CM | POA: Diagnosis not present

## 2021-04-10 DIAGNOSIS — Z955 Presence of coronary angioplasty implant and graft: Secondary | ICD-10-CM | POA: Insufficient documentation

## 2021-04-10 DIAGNOSIS — Z8249 Family history of ischemic heart disease and other diseases of the circulatory system: Secondary | ICD-10-CM | POA: Diagnosis not present

## 2021-04-10 DIAGNOSIS — E119 Type 2 diabetes mellitus without complications: Secondary | ICD-10-CM | POA: Diagnosis not present

## 2021-04-10 HISTORY — PX: COLONOSCOPY WITH PROPOFOL: SHX5780

## 2021-04-10 HISTORY — PX: POLYPECTOMY: SHX5525

## 2021-04-10 SURGERY — COLONOSCOPY WITH PROPOFOL
Anesthesia: General

## 2021-04-10 MED ORDER — PROPOFOL 10 MG/ML IV BOLUS
INTRAVENOUS | Status: DC | PRN
  Administered 2021-04-10: 100 mg via INTRAVENOUS
  Administered 2021-04-10: 75 mg via INTRAVENOUS
  Administered 2021-04-10: 30 mg via INTRAVENOUS
  Administered 2021-04-10: 25 mg via INTRAVENOUS

## 2021-04-10 MED ORDER — LIDOCAINE HCL (CARDIAC) PF 50 MG/5ML IV SOSY
PREFILLED_SYRINGE | INTRAVENOUS | Status: DC | PRN
  Administered 2021-04-10: 50 mg via INTRAVENOUS

## 2021-04-10 MED ORDER — LACTATED RINGERS IV SOLN
INTRAVENOUS | Status: DC
  Administered 2021-04-10: 1000 mL via INTRAVENOUS

## 2021-04-10 NOTE — Anesthesia Procedure Notes (Signed)
Date/Time: 04/10/2021 10:09 AM Performed by: Vista Deck, CRNA Pre-anesthesia Checklist: Patient identified, Emergency Drugs available, Suction available, Timeout performed and Patient being monitored Patient Re-evaluated:Patient Re-evaluated prior to induction Oxygen Delivery Method: Nasal Cannula

## 2021-04-10 NOTE — Anesthesia Preprocedure Evaluation (Signed)
Anesthesia Evaluation  Patient identified by MRN, date of birth, ID band Patient awake    Reviewed: Allergy & Precautions, H&P , NPO status , Patient's Chart, lab work & pertinent test results, reviewed documented beta blocker date and time   Airway Mallampati: II  TM Distance: >3 FB Neck ROM: full    Dental no notable dental hx.    Pulmonary neg pulmonary ROS,    Pulmonary exam normal breath sounds clear to auscultation       Cardiovascular Exercise Tolerance: Good hypertension, + CAD and + Past MI   Rhythm:regular Rate:Normal     Neuro/Psych PSYCHIATRIC DISORDERS Anxiety negative neurological ROS     GI/Hepatic Neg liver ROS, GERD  Medicated,  Endo/Other  diabetes, Type 2Hypothyroidism   Renal/GU negative Renal ROS  negative genitourinary   Musculoskeletal   Abdominal   Peds  Hematology negative hematology ROS (+)   Anesthesia Other Findings   Reproductive/Obstetrics negative OB ROS                             Anesthesia Physical Anesthesia Plan  ASA: 2  Anesthesia Plan: General   Post-op Pain Management:    Induction:   PONV Risk Score and Plan: Propofol infusion  Airway Management Planned:   Additional Equipment:   Intra-op Plan:   Post-operative Plan:   Informed Consent: I have reviewed the patients History and Physical, chart, labs and discussed the procedure including the risks, benefits and alternatives for the proposed anesthesia with the patient or authorized representative who has indicated his/her understanding and acceptance.     Dental Advisory Given  Plan Discussed with: CRNA  Anesthesia Plan Comments:         Anesthesia Quick Evaluation

## 2021-04-10 NOTE — Transfer of Care (Signed)
Immediate Anesthesia Transfer of Care Note  Patient: Herbert Carr  Procedure(s) Performed: COLONOSCOPY WITH PROPOFOL POLYPECTOMY  Patient Location: Short Stay  Anesthesia Type:General  Level of Consciousness: awake and patient cooperative  Airway & Oxygen Therapy: Patient Spontanous Breathing  Post-op Assessment: Report given to RN and Post -op Vital signs reviewed and stable  Post vital signs: Reviewed and stable  Last Vitals:  Vitals Value Taken Time  BP    Temp    Pulse    Resp    SpO2      Last Pain:  Vitals:   04/10/21 1010  TempSrc:   PainSc: 0-No pain      Patients Stated Pain Goal: 8 (05/69/79 4801)  Complications: No notable events documented.

## 2021-04-10 NOTE — Discharge Instructions (Addendum)
You are being discharged to home.  Resume your previous diet.  We are waiting for your pathology results.  Your physician has recommended a repeat colonoscopy for surveillance based on pathology results.  

## 2021-04-10 NOTE — Interval H&P Note (Signed)
History and Physical Interval Note:  04/10/2021 9:56 AM Herbert Carr is a 80 y.o. male with PMH anxiety, CAD s/p stent x1, GERD, HTN hypothyroidism, prostate cancer who presents for follow up of history of colon polyps.  BP (!) 159/87   Pulse 87   Temp 98.1 F (36.7 C) (Oral)   Resp 18   SpO2 100%  GENERAL: The patient is AO x3, in no acute distress. HEENT: Head is normocephalic and atraumatic. EOMI are intact. Mouth is well hydrated and without lesions. NECK: Supple. No masses LUNGS: Clear to auscultation. No presence of rhonchi/wheezing/rales. Adequate chest expansion HEART: RRR, normal s1 and s2. ABDOMEN: Soft, nontender, no guarding, no peritoneal signs, and nondistended. BS +. No masses. EXTREMITIES: Without any cyanosis, clubbing, rash, lesions or edema. NEUROLOGIC: AOx3, no focal motor deficit. SKIN: no jaundice, no rashes  AVYON HERENDEEN  has presented today for surgery, with the diagnosis of History of colonic polyps.  The various methods of treatment have been discussed with the patient and family. After consideration of risks, benefits and other options for treatment, the patient has consented to  Procedure(s) with comments: COLONOSCOPY WITH PROPOFOL (N/A) - 10:40 as a surgical intervention.  The patient's history has been reviewed, patient examined, no change in status, stable for surgery.  I have reviewed the patient's chart and labs.  Questions were answered to the patient's satisfaction.     [name]

## 2021-04-10 NOTE — Anesthesia Postprocedure Evaluation (Signed)
Anesthesia Post Note  Patient: Herbert Carr  Procedure(s) Performed: COLONOSCOPY WITH PROPOFOL POLYPECTOMY  Patient location during evaluation: Phase II Anesthesia Type: General Level of consciousness: awake Pain management: pain level controlled Vital Signs Assessment: post-procedure vital signs reviewed and stable Respiratory status: spontaneous breathing and respiratory function stable Cardiovascular status: blood pressure returned to baseline and stable Postop Assessment: no headache and no apparent nausea or vomiting Anesthetic complications: no Comments: Late entry   No notable events documented.   Last Vitals:  Vitals:   04/10/21 0910 04/10/21 1046  BP: (!) 159/87 112/64  Pulse: 87 78  Resp: 18 15  Temp: 36.7 C 36.6 C  SpO2: 100% 99%    Last Pain:  Vitals:   04/10/21 1046  TempSrc: Oral  PainSc: 0-No pain                 Louann Sjogren

## 2021-04-10 NOTE — Op Note (Addendum)
[facility] Patient Name: Herbert Carr Procedure Date: 04/10/2021 9:59 AM MRN: [id number] Date of Birth: 1941/04/20 Attending MD: Maylon Peppers ,  CSN: [id number] Age: 80 Admit Type: Outpatient Procedure:                Colonoscopy Indications:              High risk colon cancer surveillance: Personal                            history of colonic polyps Providers:                Maylon Peppers, Caprice Kluver, Morris Risa Grill, Technician Referring MD:              Medicines:                Monitored Anesthesia Care Complications:            No immediate complications. Estimated Blood Loss:     Estimated blood loss: none. Procedure:                Pre-Anesthesia Assessment:                           - Prior to the procedure, a History and Physical                            was performed, and patient medications, allergies                            and sensitivities were reviewed. The patient's                            tolerance of previous anesthesia was reviewed.                           - The risks and benefits of the procedure and the                            sedation options and risks were discussed with the                            patient. All questions were answered and informed                            consent was obtained.                           - ASA Grade Assessment: III - A patient with severe                            systemic disease.                           After obtaining informed consent, the colonoscope  was passed under direct vision. Throughout the                            procedure, the patient's blood pressure, pulse, and                            oxygen saturations were monitored continuously. The                            [id number]-[id number] ([id number]) scope was introduced through                            the anus and advanced to the the cecum, identified                             by appendiceal orifice and ileocecal valve. The                            colonoscopy was performed without difficulty. The                            patient tolerated the procedure well. The quality                            of the bowel preparation was adequate. Scope In: 10:14:52 AM Scope Out: 10:39:11 AM Scope Withdrawal Time: 0 hours 19 minutes 40 seconds  Total Procedure Duration: 0 hours 24 minutes 19 seconds  Findings:      The perianal and digital rectal examinations were normal.      Two sessile polyps were found in the ascending colon. The polyps were 3       to 5 mm in size. These polyps were removed with a cold snare. Resection       and retrieval were complete.      The retroflexed view of the distal rectum and anal verge was normal and       showed no anal or rectal abnormalities. Impression:               - Two 3 to 5 mm polyps in the ascending colon,                            removed with a cold snare. Resected and retrieved.                           - The distal rectum and anal verge are normal on                            retroflexion view. Moderate Sedation:      Per Anesthesia Care Recommendation:           - Discharge patient to home (ambulatory).                           - Resume previous diet.                           -  Await pathology results.                           - Repeat colonoscopy is not recommended due to                            current age (24 years or older) for screening                            purposes. Procedure Code(s):        --- Professional ---                           [phone], Colonoscopy, flexible; with removal of                            tumor(s), polyp(s), or other lesion(s) by snare                            technique Diagnosis Code(s):        --- Professional ---                           Z86.010, Personal history of colonic polyps                           K63.5, Polyp of colon CPT copyright 2019 American Medical  Association. All rights reserved. The codes documented in this report are preliminary and upon coder review may  be revised to meet current compliance requirements. Maylon Peppers, MD Maylon Peppers,  04/10/2021 10:48:43 AM This report has been signed electronically. Number of Addenda: 0

## 2021-04-11 LAB — SURGICAL PATHOLOGY

## 2021-04-12 ENCOUNTER — Encounter (HOSPITAL_COMMUNITY): Payer: Self-pay | Admitting: Gastroenterology

## 2021-04-17 DIAGNOSIS — E039 Hypothyroidism, unspecified: Secondary | ICD-10-CM | POA: Diagnosis not present

## 2021-04-30 DIAGNOSIS — L989 Disorder of the skin and subcutaneous tissue, unspecified: Secondary | ICD-10-CM | POA: Diagnosis not present

## 2021-04-30 DIAGNOSIS — E039 Hypothyroidism, unspecified: Secondary | ICD-10-CM | POA: Diagnosis not present

## 2021-04-30 DIAGNOSIS — I2510 Atherosclerotic heart disease of native coronary artery without angina pectoris: Secondary | ICD-10-CM | POA: Diagnosis not present

## 2021-04-30 DIAGNOSIS — L409 Psoriasis, unspecified: Secondary | ICD-10-CM | POA: Diagnosis not present

## 2021-04-30 DIAGNOSIS — Z6820 Body mass index (BMI) 20.0-20.9, adult: Secondary | ICD-10-CM | POA: Diagnosis not present

## 2021-04-30 DIAGNOSIS — Z23 Encounter for immunization: Secondary | ICD-10-CM | POA: Diagnosis not present

## 2021-04-30 DIAGNOSIS — Z9049 Acquired absence of other specified parts of digestive tract: Secondary | ICD-10-CM | POA: Diagnosis not present

## 2021-04-30 DIAGNOSIS — I10 Essential (primary) hypertension: Secondary | ICD-10-CM | POA: Diagnosis not present

## 2021-05-10 ENCOUNTER — Other Ambulatory Visit (INDEPENDENT_AMBULATORY_CARE_PROVIDER_SITE_OTHER): Payer: Self-pay | Admitting: Gastroenterology

## 2021-05-10 DIAGNOSIS — R140 Abdominal distension (gaseous): Secondary | ICD-10-CM

## 2021-05-14 DIAGNOSIS — Z23 Encounter for immunization: Secondary | ICD-10-CM | POA: Diagnosis not present

## 2021-05-30 ENCOUNTER — Encounter: Payer: Self-pay | Admitting: Cardiology

## 2021-05-30 ENCOUNTER — Ambulatory Visit: Payer: Medicare HMO | Admitting: Cardiology

## 2021-05-30 VITALS — BP 128/70 | HR 70 | Ht 72.0 in | Wt 157.0 lb

## 2021-05-30 DIAGNOSIS — I10 Essential (primary) hypertension: Secondary | ICD-10-CM | POA: Diagnosis not present

## 2021-05-30 DIAGNOSIS — I25119 Atherosclerotic heart disease of native coronary artery with unspecified angina pectoris: Secondary | ICD-10-CM | POA: Diagnosis not present

## 2021-05-30 DIAGNOSIS — I493 Ventricular premature depolarization: Secondary | ICD-10-CM
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
## 2021-05-30 NOTE — Patient Instructions (Addendum)

## 2021-06-11 DIAGNOSIS — E039 Hypothyroidism, unspecified: Secondary | ICD-10-CM | POA: Diagnosis not present

## 2021-08-13 ENCOUNTER — Other Ambulatory Visit (INDEPENDENT_AMBULATORY_CARE_PROVIDER_SITE_OTHER): Payer: Self-pay | Admitting: Gastroenterology

## 2021-08-13 DIAGNOSIS — R140 Abdominal distension (gaseous): Secondary | ICD-10-CM

## 2021-10-16 DIAGNOSIS — I10 Essential (primary) hypertension: Secondary | ICD-10-CM | POA: Diagnosis not present

## 2021-10-16 DIAGNOSIS — G4700 Insomnia, unspecified: Secondary | ICD-10-CM | POA: Diagnosis not present

## 2021-10-16 DIAGNOSIS — Z6820 Body mass index (BMI) 20.0-20.9, adult: Secondary | ICD-10-CM | POA: Diagnosis not present

## 2021-10-25 DIAGNOSIS — Z9049 Acquired absence of other specified parts of digestive tract: Secondary | ICD-10-CM | POA: Diagnosis not present

## 2021-10-25 DIAGNOSIS — L409 Psoriasis, unspecified: Secondary | ICD-10-CM | POA: Diagnosis not present

## 2021-10-25 DIAGNOSIS — G4700 Insomnia, unspecified: Secondary | ICD-10-CM | POA: Diagnosis not present

## 2021-10-25 DIAGNOSIS — I10 Essential (primary) hypertension: Secondary | ICD-10-CM | POA: Diagnosis not present

## 2021-10-25 DIAGNOSIS — I2510 Atherosclerotic heart disease of native coronary artery without angina pectoris: Secondary | ICD-10-CM | POA: Diagnosis not present

## 2021-10-25 DIAGNOSIS — L989 Disorder of the skin and subcutaneous tissue, unspecified: Secondary | ICD-10-CM | POA: Diagnosis not present

## 2021-10-25 DIAGNOSIS — E039 Hypothyroidism, unspecified: Secondary | ICD-10-CM | POA: Diagnosis not present

## 2021-10-25 DIAGNOSIS — Z6820 Body mass index (BMI) 20.0-20.9, adult: Secondary | ICD-10-CM | POA: Diagnosis not present

## 2021-11-14 DIAGNOSIS — I2510 Atherosclerotic heart disease of native coronary artery without angina pectoris: Secondary | ICD-10-CM | POA: Diagnosis not present

## 2021-11-14 DIAGNOSIS — L989 Disorder of the skin and subcutaneous tissue, unspecified: Secondary | ICD-10-CM | POA: Diagnosis not present

## 2021-11-14 DIAGNOSIS — I10 Essential (primary) hypertension: Secondary | ICD-10-CM | POA: Diagnosis not present

## 2021-11-14 DIAGNOSIS — E039 Hypothyroidism, unspecified: Secondary | ICD-10-CM | POA: Diagnosis not present

## 2021-11-14 DIAGNOSIS — L409 Psoriasis, unspecified: Secondary | ICD-10-CM | POA: Diagnosis not present

## 2021-11-14 DIAGNOSIS — J329 Chronic sinusitis, unspecified: Secondary | ICD-10-CM | POA: Diagnosis not present

## 2021-11-14 DIAGNOSIS — Z9049 Acquired absence of other specified parts of digestive tract: Secondary | ICD-10-CM | POA: Diagnosis not present

## 2021-11-14 DIAGNOSIS — Z6820 Body mass index (BMI) 20.0-20.9, adult: Secondary | ICD-10-CM | POA: Diagnosis not present

## 2021-11-27 DIAGNOSIS — L570 Actinic keratosis: Secondary | ICD-10-CM | POA: Diagnosis not present

## 2021-11-27 DIAGNOSIS — L400 Psoriasis vulgaris: Secondary | ICD-10-CM | POA: Diagnosis not present

## 2021-11-27 DIAGNOSIS — X32XXXA Exposure to sunlight, initial encounter: Secondary | ICD-10-CM | POA: Diagnosis not present

## 2021-12-11 DIAGNOSIS — L400 Psoriasis vulgaris: Secondary | ICD-10-CM | POA: Diagnosis not present

## 2021-12-17 DIAGNOSIS — Z6820 Body mass index (BMI) 20.0-20.9, adult: Secondary | ICD-10-CM | POA: Diagnosis not present

## 2021-12-17 DIAGNOSIS — E039 Hypothyroidism, unspecified: Secondary | ICD-10-CM | POA: Diagnosis not present

## 2021-12-17 DIAGNOSIS — Z9049 Acquired absence of other specified parts of digestive tract: Secondary | ICD-10-CM | POA: Diagnosis not present

## 2021-12-17 DIAGNOSIS — L989 Disorder of the skin and subcutaneous tissue, unspecified: Secondary | ICD-10-CM | POA: Diagnosis not present

## 2021-12-17 DIAGNOSIS — I2510 Atherosclerotic heart disease of native coronary artery without angina pectoris: Secondary | ICD-10-CM | POA: Diagnosis not present

## 2021-12-17 DIAGNOSIS — G4700 Insomnia, unspecified: Secondary | ICD-10-CM | POA: Diagnosis not present

## 2021-12-17 DIAGNOSIS — L409 Psoriasis, unspecified: Secondary | ICD-10-CM | POA: Diagnosis not present

## 2021-12-17 DIAGNOSIS — I10 Essential (primary) hypertension: Secondary | ICD-10-CM | POA: Diagnosis not present

## 2021-12-30 ENCOUNTER — Emergency Department (HOSPITAL_COMMUNITY): Payer: Medicare HMO

## 2021-12-30 ENCOUNTER — Encounter (HOSPITAL_COMMUNITY): Payer: Self-pay | Admitting: *Deleted

## 2021-12-30 ENCOUNTER — Other Ambulatory Visit: Payer: Self-pay

## 2021-12-30 ENCOUNTER — Inpatient Hospital Stay (HOSPITAL_COMMUNITY)
Admission: EM | Admit: 2021-12-30 | Discharge: 2022-01-01 | DRG: 446 | Disposition: A | Discharge status: Home / Self Care | Payer: Medicare HMO | Attending: Internal Medicine | Admitting: Internal Medicine

## 2021-12-30 DIAGNOSIS — Q272 Other congenital malformations of renal artery: Secondary | ICD-10-CM | POA: Diagnosis not present

## 2021-12-30 DIAGNOSIS — Z8546 Personal history of malignant neoplasm of prostate: Secondary | ICD-10-CM | POA: Diagnosis not present

## 2021-12-30 DIAGNOSIS — K8030 Calculus of bile duct with cholangitis, unspecified, without obstruction: Secondary | ICD-10-CM | POA: Diagnosis not present

## 2021-12-30 DIAGNOSIS — R1084 Generalized abdominal pain: Secondary | ICD-10-CM | POA: Diagnosis not present

## 2021-12-30 DIAGNOSIS — K219 Gastro-esophageal reflux disease without esophagitis: Secondary | ICD-10-CM | POA: Diagnosis present

## 2021-12-30 DIAGNOSIS — I2510 Atherosclerotic heart disease of native coronary artery without angina pectoris: Secondary | ICD-10-CM | POA: Diagnosis present

## 2021-12-30 DIAGNOSIS — I959 Hypotension, unspecified: Secondary | ICD-10-CM | POA: Diagnosis not present

## 2021-12-30 DIAGNOSIS — L409 Psoriasis, unspecified: Secondary | ICD-10-CM | POA: Diagnosis present

## 2021-12-30 DIAGNOSIS — E871 Hypo-osmolality and hyponatremia: Secondary | ICD-10-CM | POA: Diagnosis not present

## 2021-12-30 DIAGNOSIS — I700 Atherosclerosis of aorta: Secondary | ICD-10-CM | POA: Diagnosis not present

## 2021-12-30 DIAGNOSIS — K8050 Calculus of bile duct without cholangitis or cholecystitis without obstruction: Secondary | ICD-10-CM

## 2021-12-30 DIAGNOSIS — I252 Old myocardial infarction: Secondary | ICD-10-CM | POA: Diagnosis not present

## 2021-12-30 DIAGNOSIS — R160 Hepatomegaly, not elsewhere classified: Secondary | ICD-10-CM | POA: Diagnosis present

## 2021-12-30 DIAGNOSIS — R748 Abnormal levels of other serum enzymes: Secondary | ICD-10-CM | POA: Diagnosis present

## 2021-12-30 DIAGNOSIS — Z8249 Family history of ischemic heart disease and other diseases of the circulatory system: Secondary | ICD-10-CM

## 2021-12-30 DIAGNOSIS — Z955 Presence of coronary angioplasty implant and graft: Secondary | ICD-10-CM

## 2021-12-30 DIAGNOSIS — Z833 Family history of diabetes mellitus: Secondary | ICD-10-CM | POA: Diagnosis not present

## 2021-12-30 DIAGNOSIS — E039 Hypothyroidism, unspecified: Secondary | ICD-10-CM | POA: Diagnosis not present

## 2021-12-30 DIAGNOSIS — K8000 Calculus of gallbladder with acute cholecystitis without obstruction: Secondary | ICD-10-CM | POA: Diagnosis not present

## 2021-12-30 DIAGNOSIS — Z79890 Hormone replacement therapy (postmenopausal): Secondary | ICD-10-CM | POA: Diagnosis not present

## 2021-12-30 DIAGNOSIS — K8309 Other cholangitis: Secondary | ICD-10-CM

## 2021-12-30 DIAGNOSIS — Z970 Presence of artificial eye: Secondary | ICD-10-CM | POA: Diagnosis not present

## 2021-12-30 DIAGNOSIS — Z9861 Coronary angioplasty status: Secondary | ICD-10-CM

## 2021-12-30 DIAGNOSIS — R52 Pain, unspecified: Secondary | ICD-10-CM | POA: Diagnosis not present

## 2021-12-30 DIAGNOSIS — F419 Anxiety disorder, unspecified: Secondary | ICD-10-CM | POA: Diagnosis present

## 2021-12-30 DIAGNOSIS — H5462 Unqualified visual loss, left eye, normal vision right eye: Secondary | ICD-10-CM | POA: Diagnosis present

## 2021-12-30 DIAGNOSIS — Z806 Family history of leukemia: Secondary | ICD-10-CM | POA: Diagnosis not present

## 2021-12-30 DIAGNOSIS — R000 Tachycardia, unspecified: Secondary | ICD-10-CM | POA: Diagnosis present

## 2021-12-30 DIAGNOSIS — E038 Other specified hypothyroidism: Secondary | ICD-10-CM

## 2021-12-30 DIAGNOSIS — H5440 Blindness, one eye, unspecified eye: Secondary | ICD-10-CM | POA: Diagnosis present

## 2021-12-30 DIAGNOSIS — Z79899 Other long term (current) drug therapy: Secondary | ICD-10-CM | POA: Diagnosis not present

## 2021-12-30 DIAGNOSIS — Z7982 Long term (current) use of aspirin: Secondary | ICD-10-CM

## 2021-12-30 DIAGNOSIS — Z9049 Acquired absence of other specified parts of digestive tract: Secondary | ICD-10-CM | POA: Diagnosis not present

## 2021-12-30 DIAGNOSIS — I10 Essential (primary) hypertension: Secondary | ICD-10-CM | POA: Diagnosis present

## 2021-12-30 DIAGNOSIS — K769 Liver disease, unspecified: Secondary | ICD-10-CM | POA: Diagnosis not present

## 2021-12-30 DIAGNOSIS — D696 Thrombocytopenia, unspecified: Secondary | ICD-10-CM | POA: Diagnosis not present

## 2021-12-30 DIAGNOSIS — E278 Other specified disorders of adrenal gland: Secondary | ICD-10-CM | POA: Diagnosis not present

## 2021-12-30 DIAGNOSIS — K838 Other specified diseases of biliary tract: Secondary | ICD-10-CM | POA: Diagnosis not present

## 2021-12-30 DIAGNOSIS — R9431 Abnormal electrocardiogram [ECG] [EKG]: Secondary | ICD-10-CM | POA: Diagnosis not present

## 2021-12-30 LAB — CBC
HCT: 43.5 % (ref 39.0–52.0)
Hemoglobin: 15.2 g/dL (ref 13.0–17.0)
MCH: 32.4 pg (ref 26.0–34.0)
MCHC: 34.9 g/dL (ref 30.0–36.0)
MCV: 92.8 fL (ref 80.0–100.0)
Platelets: 156 10*3/uL (ref 150–400)
RBC: 4.69 MIL/uL (ref 4.22–5.81)
RDW: 12.4 % (ref 11.5–15.5)
WBC: 17.4 10*3/uL — ABNORMAL HIGH (ref 4.0–10.5)
nRBC: 0 % (ref 0.0–0.2)

## 2021-12-30 LAB — TROPONIN I (HIGH SENSITIVITY)
Troponin I (High Sensitivity): 5 ng/L (ref <18)
Troponin I (High Sensitivity): 7 ng/L (ref <18)

## 2021-12-30 LAB — COMPREHENSIVE METABOLIC PANEL
ALT: 445 U/L — ABNORMAL HIGH (ref 0–44)
AST: 609 U/L — ABNORMAL HIGH (ref 15–41)
Albumin: 4.6 g/dL (ref 3.5–5.0)
Alkaline Phosphatase: 195 U/L — ABNORMAL HIGH (ref 38–126)
Anion gap: 10 (ref 5–15)
BUN: 14 mg/dL (ref 8–23)
CO2: 25 mmol/L (ref 22–32)
Calcium: 9.2 mg/dL (ref 8.9–10.3)
Chloride: 100 mmol/L (ref 98–111)
Creatinine, Ser: 0.47 mg/dL — ABNORMAL LOW (ref 0.61–1.24)
GFR, Estimated: >60 mL/min (ref >60)
Glucose, Bld: 149 mg/dL — ABNORMAL HIGH (ref 70–99)
Potassium: 5 mmol/L (ref 3.5–5.1)
Sodium: 135 mmol/L (ref 135–145)
Total Bilirubin: 4.8 mg/dL — ABNORMAL HIGH (ref 0.3–1.2)
Total Protein: 8 g/dL (ref 6.5–8.1)

## 2021-12-30 LAB — LACTIC ACID, PLASMA
Lactic Acid, Venous: 3 mmol/L (ref 0.5–1.9)
Lactic Acid, Venous: 1.6 mmol/L (ref 0.5–1.9)

## 2021-12-30 LAB — URINALYSIS, ROUTINE W REFLEX MICROSCOPIC
Bilirubin Urine: NEGATIVE
Glucose, UA: NEGATIVE mg/dL
Hgb urine dipstick: NEGATIVE
Ketones, ur: 5 mg/dL — AB
Leukocytes,Ua: NEGATIVE
Nitrite: NEGATIVE
Protein, ur: NEGATIVE mg/dL
Specific Gravity, Urine: 1.04 — ABNORMAL HIGH (ref 1.005–1.030)
pH: 6 (ref 5.0–8.0)

## 2021-12-30 LAB — LIPASE, BLOOD: Lipase: 40 U/L (ref 11–51)

## 2021-12-30 LAB — MAGNESIUM: Magnesium: 2.1 mg/dL (ref 1.7–2.4)

## 2021-12-30 LAB — PROTIME-INR
INR: 1.2 (ref 0.8–1.2)
Prothrombin Time: 15.1 seconds (ref 11.4–15.2)

## 2021-12-30 LAB — APTT: aPTT: 31 seconds (ref 24–36)

## 2021-12-30 MED ORDER — SODIUM CHLORIDE 0.9 % IV SOLN
2.0000 g | Freq: Three times a day (TID) | INTRAVENOUS | Status: DC
  Administered 2021-12-31 – 2022-01-01 (×5): 2 g via INTRAVENOUS
  Filled 2021-12-30 (×5): qty 12.5

## 2021-12-30 MED ORDER — POLYETHYLENE GLYCOL 3350 17 G PO PACK: 17.0000 g | PACK | Freq: Every day | ORAL | Status: DC | PRN

## 2021-12-30 MED ORDER — ACETAMINOPHEN 325 MG PO TABS: 650.0000 mg | ORAL_TABLET | Freq: Four times a day (QID) | ORAL | Status: DC | PRN

## 2021-12-30 MED ORDER — SODIUM CHLORIDE 0.9 % IV SOLN
2.0000 g | Freq: Once | INTRAVENOUS | Status: DC
  Filled 2021-12-30: qty 12.5

## 2021-12-30 MED ORDER — CLONAZEPAM 0.5 MG PO TABS
0.5000 mg | ORAL_TABLET | Freq: Every evening | ORAL | Status: DC | PRN
  Administered 2021-12-30: 0.5 mg via ORAL
  Filled 2021-12-30: qty 1

## 2021-12-30 MED ORDER — SODIUM CHLORIDE 0.9 % IV BOLUS (SEPSIS)
1000.0000 mL | Freq: Once | INTRAVENOUS | Status: AC
  Administered 2021-12-30: 1000 mL via INTRAVENOUS

## 2021-12-30 MED ORDER — PROMETHAZINE HCL 25 MG PO TABS: 12.5000 mg | ORAL_TABLET | Freq: Four times a day (QID) | ORAL | Status: DC | PRN

## 2021-12-30 MED ORDER — ACETAMINOPHEN 650 MG RE SUPP: 650.0000 mg | Freq: Four times a day (QID) | RECTAL | Status: DC | PRN

## 2021-12-30 MED ORDER — ASPIRIN 81 MG PO TBEC
81.0000 mg | DELAYED_RELEASE_TABLET | Freq: Every day | ORAL | Status: DC
  Administered 2022-01-01: 81 mg via ORAL
  Filled 2021-12-30: qty 1

## 2021-12-30 MED ORDER — MORPHINE SULFATE (PF) 2 MG/ML IV SOLN: 2.0000 mg | INTRAVENOUS | Status: DC | PRN

## 2021-12-30 MED ORDER — PANTOPRAZOLE SODIUM 40 MG IV SOLR
40.0000 mg | INTRAVENOUS | Status: DC
  Administered 2021-12-30 – 2021-12-31 (×2): 40 mg via INTRAVENOUS
  Filled 2021-12-30 (×2): qty 10

## 2021-12-30 MED ORDER — METRONIDAZOLE 500 MG/100ML IV SOLN
500.0000 mg | Freq: Two times a day (BID) | INTRAVENOUS | Status: DC
  Administered 2021-12-31 – 2022-01-01 (×3): 500 mg via INTRAVENOUS
  Filled 2021-12-30 (×3): qty 100

## 2021-12-30 MED ORDER — IOHEXOL 350 MG/ML SOLN
100.0000 mL | Freq: Once | INTRAVENOUS | Status: AC | PRN
  Administered 2021-12-30: 100 mL via INTRAVENOUS

## 2021-12-30 MED ORDER — METRONIDAZOLE 500 MG/100ML IV SOLN
500.0000 mg | Freq: Once | INTRAVENOUS | Status: AC
  Administered 2021-12-30: 500 mg via INTRAVENOUS
  Filled 2021-12-30: qty 100

## 2021-12-30 MED ORDER — FENTANYL CITRATE PF 50 MCG/ML IJ SOSY
50.0000 ug | PREFILLED_SYRINGE | Freq: Once | INTRAMUSCULAR | Status: AC
  Administered 2021-12-30: 50 ug via INTRAVENOUS
  Filled 2021-12-30: qty 1

## 2021-12-30 MED ORDER — LEVOTHYROXINE SODIUM 100 MCG PO TABS
100.0000 ug | ORAL_TABLET | Freq: Every day | ORAL | Status: DC
Start: 2021-12-31 — End: 2022-01-02
  Administered 2022-01-01: 100 ug via ORAL
  Filled 2021-12-30: qty 1

## 2021-12-30 MED ORDER — METOPROLOL SUCCINATE ER 25 MG PO TB24
25.0000 mg | ORAL_TABLET | Freq: Every day | ORAL | Status: DC
  Administered 2022-01-01: 25 mg via ORAL
  Filled 2021-12-30: qty 1

## 2021-12-30 MED ORDER — LACTATED RINGERS IV BOLUS
500.0000 mL | Freq: Once | INTRAVENOUS | Status: AC
  Administered 2021-12-30: 500 mL via INTRAVENOUS

## 2021-12-30 MED ORDER — LACTATED RINGERS IV SOLN: INTRAVENOUS | Status: AC

## 2021-12-30 MED ORDER — LACTATED RINGERS IV SOLN: INTRAVENOUS | Status: DC

## 2021-12-30 NOTE — Assessment & Plan Note (Signed)
Blood pressure 130s to 160s. -Resume home metoprolol

## 2021-12-30 NOTE — Assessment & Plan Note (Addendum)
CAD status post anterior STEMI 2018 with DES.  No chest pain.  EKG with artifacts, but no significant abnormalities.  Follows with Dr. Domenic Polite. -Resume aspirin, metoprolol -Repeat EKG in the morning

## 2021-12-30 NOTE — Assessment & Plan Note (Addendum)
Presenting with abdominal pain.  Afebrile-temperature 97.8.  Respirate rate 15-25, heart rate 62-95, but has a leukocytosis of 17.4.  With lactic acid of 3.  Concerning for early sepsis.  Liver enzymes elevated, AST 609, ALT 445, ALP 195.  Total bilirubin 4.8.  Lipase normal 40. -CT abdomen and pelvis shows 3.2 x 2.9 cm hyperenhancing mass in the left liver, and intra and extrahepatic biliary ductal dilatation with 3 noncalcified distal common bile duct stones.  MRI/MRCP recommended with and without contrast for further evaluation of both. -I talked to Dr. Jenetta Downer, he recommended patient be admitted to [facility], where beds are available for ERCP. -Patient had ERCP last year for similar situation by Dr. Watt Climes of [facility], I reached out to [facility], Dr. Paulita Fujita says patient is unassigned as he was not seen in the clinic. - I consulted [facility]- Dr. Loletha Carrow- patient will be seen in the morning. -Continue IV cefepime and metronidazole started in ED -Follow-up blood cultures -Clear liquid diet, n.p.o. midnight -IV morphine 2 mg as needed - 1.5 L bolus given, continue LR 75cc/hr x 1 day -Trend lactic acid

## 2021-12-30 NOTE — ED Notes (Signed)
Patient to CT at this time

## 2021-12-30 NOTE — ED Notes (Signed)
ED Provider at bedside. 

## 2021-12-30 NOTE — ED Provider Notes (Signed)
  Physical Exam  BP (!) 154/75   Pulse 62   Temp 97.8 F (36.6 C) (Oral)   Resp (!) 25   Ht 6' (1.829 m)   Wt 72.6 kg   SpO2 97%   BMI 21.70 kg/m   Physical Exam  Procedures  .Critical Care  Performed by: Varney Biles, MD Authorized by: Varney Biles, MD   Critical care provider statement:    Critical care time (minutes):  30   Critical care was necessary to treat or prevent imminent or life-threatening deterioration of the following conditions:  Hepatic failure (Bilirubin greater than 4)   Critical care was time spent personally by me on the following activities:  Development of treatment plan with patient or surrogate, discussions with consultants, evaluation of patient's response to treatment, examination of patient, ordering and review of laboratory studies, ordering and review of radiographic studies, ordering and performing treatments and interventions, pulse oximetry, re-evaluation of patient's condition and review of old charts   ED Course / MDM    Medical Decision Making Amount and/or Complexity of Data Reviewed Labs: ordered. Radiology: ordered.  Risk Prescription drug management.   81 y/o M with hx of CAD  and biliary sepsis comes in with cc of abdominal pain, sudden onset this morning.  In 2022 patient was noted to have severe sepsis secondary to Klebsiella bacteremia, cholangitis and cholecystitis.  He had ERCP done at that time and also had cholecystectomy.  Patient's lab values reveals elevated white count of 17.4. He was afebrile at arrival, no tachycardia and has an isolated event of increased respiratory rate.  Labs also reveal elevated transaminases and bilirubin.  CT dissection was ordered, results are pending.  4:45 PM  CT scan visualized and interpreted independently, with the scope of emergency medicine diagnosis.  It appears that he has possible stones in CBD.  No perforation.  Radiology interpretation is revealing likely dilated common bile  duct and stones.  There is also enhancing mass in the liver.  Patient has elevated white count.  On reassessment, he states that he feels better, pain is resolved.  Given that he is at high risk for infection given his age and there will be delay in managing his pathology, we will start him on antibiotics.  Last time when he was admitted, he had choledocholithiasis, cholangitis, cholecystitis and sepsis with bacteremia.  Blood cultures also sent.       Varney Biles, MD 12/30/21 [id number]

## 2021-12-30 NOTE — Progress Notes (Signed)
Pharmacy Antibiotic Note  Herbert Carr is a 81 y.o. male admitted on 12/30/2021 with  intra-abdominal infection .  Pharmacy has been consulted for Cefepime dosing.  Plan: Cefepime 2000 mg IV every 8 hours.  Height: 6' (182.9 cm) Weight: 72.6 kg (160 lb) IBW/kg (Calculated) : 77.6  Temp (24hrs), Avg:97.8 F (36.6 C), Min:97.8 F (36.6 C), Max:97.8 F (36.6 C)  Recent Labs  Lab 12/30/21 1421  WBC 17.4*  CREATININE 0.47*    Estimated Creatinine Clearance: 73.1 mL/min (A) (by C-G formula based on SCr of 0.47 mg/dL (L)).    No Known Allergies  Antimicrobials this admission: Cefepime 7/9 >> Flagyl 7/9 >>  Thank you for allowing pharmacy to be a part of this patient's care.  Ramond Craver 12/30/2021 4:59 PM
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
## 2021-12-30 NOTE — Progress Notes (Signed)
[facility] following for sepsis protocol. 

## 2021-12-30 NOTE — ED Triage Notes (Signed)
Pt with abd pain since this morning without N/V, 2 loose stools today per pt.
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
## 2021-12-30 NOTE — ED Notes (Signed)
IV positional. Requested to re-start IV and patient declines at this time.

## 2021-12-31 ENCOUNTER — Inpatient Hospital Stay (HOSPITAL_COMMUNITY): Payer: Medicare HMO | Admitting: Anesthesiology

## 2021-12-31 ENCOUNTER — Encounter (HOSPITAL_COMMUNITY)
Admission: EM | Disposition: A | Discharge status: Home / Self Care | Payer: Self-pay | Source: Home / Self Care | Attending: Internal Medicine

## 2021-12-31 ENCOUNTER — Inpatient Hospital Stay (HOSPITAL_COMMUNITY): Payer: Medicare HMO

## 2021-12-31 ENCOUNTER — Encounter (HOSPITAL_COMMUNITY): Payer: Self-pay | Admitting: Internal Medicine

## 2021-12-31 DIAGNOSIS — K8309 Other cholangitis: Secondary | ICD-10-CM | POA: Diagnosis not present

## 2021-12-31 DIAGNOSIS — I2510 Atherosclerotic heart disease of native coronary artery without angina pectoris: Secondary | ICD-10-CM

## 2021-12-31 DIAGNOSIS — K8030 Calculus of bile duct with cholangitis, unspecified, without obstruction: Secondary | ICD-10-CM

## 2021-12-31 DIAGNOSIS — I252 Old myocardial infarction: Secondary | ICD-10-CM

## 2021-12-31 DIAGNOSIS — R748 Abnormal levels of other serum enzymes: Secondary | ICD-10-CM | POA: Diagnosis not present

## 2021-12-31 DIAGNOSIS — I10 Essential (primary) hypertension: Secondary | ICD-10-CM

## 2021-12-31 DIAGNOSIS — K8050 Calculus of bile duct without cholangitis or cholecystitis without obstruction: Secondary | ICD-10-CM | POA: Diagnosis not present

## 2021-12-31 HISTORY — PX: BILIARY STENT PLACEMENT: SHX5538

## 2021-12-31 HISTORY — PX: REMOVAL OF STONES: SHX5545

## 2021-12-31 HISTORY — PX: ERCP: SHX5425

## 2021-12-31 HISTORY — PX: BILIARY DILATION: SHX6850

## 2021-12-31 LAB — CBC
HCT: 32.6 % — ABNORMAL LOW (ref 39.0–52.0)
Hemoglobin: 11.9 g/dL — ABNORMAL LOW (ref 13.0–17.0)
MCH: 32.8 pg (ref 26.0–34.0)
MCHC: 36.5 g/dL — ABNORMAL HIGH (ref 30.0–36.0)
MCV: 89.8 fL (ref 80.0–100.0)
Platelets: 121 10*3/uL — ABNORMAL LOW (ref 150–400)
RBC: 3.63 MIL/uL — ABNORMAL LOW (ref 4.22–5.81)
RDW: 12.1 % (ref 11.5–15.5)
WBC: 13 10*3/uL — ABNORMAL HIGH (ref 4.0–10.5)
nRBC: 0 % (ref 0.0–0.2)

## 2021-12-31 LAB — COMPREHENSIVE METABOLIC PANEL
ALT: 324 U/L — ABNORMAL HIGH (ref 0–44)
AST: 251 U/L — ABNORMAL HIGH (ref 15–41)
Albumin: 2.8 g/dL — ABNORMAL LOW (ref 3.5–5.0)
Alkaline Phosphatase: 136 U/L — ABNORMAL HIGH (ref 38–126)
Anion gap: 10 (ref 5–15)
BUN: 11 mg/dL (ref 8–23)
CO2: 23 mmol/L (ref 22–32)
Calcium: 8.4 mg/dL — ABNORMAL LOW (ref 8.9–10.3)
Chloride: 102 mmol/L (ref 98–111)
Creatinine, Ser: 0.72 mg/dL (ref 0.61–1.24)
GFR, Estimated: >60 mL/min (ref >60)
Glucose, Bld: 89 mg/dL (ref 70–99)
Potassium: 3.4 mmol/L — ABNORMAL LOW (ref 3.5–5.1)
Sodium: 135 mmol/L (ref 135–145)
Total Bilirubin: 5.3 mg/dL — ABNORMAL HIGH (ref 0.3–1.2)
Total Protein: 5.3 g/dL — ABNORMAL LOW (ref 6.5–8.1)

## 2021-12-31 SURGERY — ERCP, WITH INTERVENTION IF INDICATED
Anesthesia: General

## 2021-12-31 MED ORDER — PHENYLEPHRINE 80 MCG/ML (10ML) SYRINGE FOR IV PUSH (FOR BLOOD PRESSURE SUPPORT)
PREFILLED_SYRINGE | INTRAVENOUS | Status: DC | PRN
  Administered 2021-12-31: 160 ug via INTRAVENOUS
  Administered 2021-12-31: 80 ug via INTRAVENOUS

## 2021-12-31 MED ORDER — ONDANSETRON HCL 4 MG/2ML IJ SOLN
INTRAMUSCULAR | Status: DC | PRN
  Administered 2021-12-31: 4 mg via INTRAVENOUS

## 2021-12-31 MED ORDER — FENTANYL CITRATE (PF) 250 MCG/5ML IJ SOLN
INTRAMUSCULAR | Status: DC | PRN
  Administered 2021-12-31: 50 ug via INTRAVENOUS

## 2021-12-31 MED ORDER — ROCURONIUM BROMIDE 10 MG/ML (PF) SYRINGE
PREFILLED_SYRINGE | INTRAVENOUS | Status: DC | PRN
  Administered 2021-12-31: 50 mg via INTRAVENOUS

## 2021-12-31 MED ORDER — GLUCAGON HCL RDNA (DIAGNOSTIC) 1 MG IJ SOLR
INTRAMUSCULAR | Status: AC
  Filled 2021-12-31: qty 1

## 2021-12-31 MED ORDER — LIDOCAINE 2% (20 MG/ML) 5 ML SYRINGE
INTRAMUSCULAR | Status: DC | PRN
  Administered 2021-12-31: 60 mg via INTRAVENOUS

## 2021-12-31 MED ORDER — SODIUM CHLORIDE 0.9 % IV SOLN
INTRAVENOUS | Status: DC | PRN
  Administered 2021-12-31: 70 mL

## 2021-12-31 MED ORDER — SUGAMMADEX SODIUM 200 MG/2ML IV SOLN
INTRAVENOUS | Status: DC | PRN
  Administered 2021-12-31: 200 mg via INTRAVENOUS

## 2021-12-31 MED ORDER — SODIUM CHLORIDE 0.9 % IV SOLN: INTRAVENOUS | Status: DC

## 2021-12-31 MED ORDER — FENTANYL CITRATE (PF) 100 MCG/2ML IJ SOLN
INTRAMUSCULAR | Status: AC
  Filled 2021-12-31: qty 2

## 2021-12-31 MED ORDER — LACTATED RINGERS IV SOLN: INTRAVENOUS | Status: DC

## 2021-12-31 MED ORDER — DEXAMETHASONE SODIUM PHOSPHATE 10 MG/ML IJ SOLN
INTRAMUSCULAR | Status: DC | PRN
  Administered 2021-12-31: 5 mg via INTRAVENOUS

## 2021-12-31 MED ORDER — GLUCAGON HCL RDNA (DIAGNOSTIC) 1 MG IJ SOLR
INTRAMUSCULAR | Status: DC | PRN
  Administered 2021-12-31: .5 mg via INTRAVENOUS

## 2021-12-31 MED ORDER — INDOMETHACIN 50 MG RE SUPP
RECTAL | Status: DC | PRN
  Administered 2021-12-31: 100 mg via RECTAL

## 2021-12-31 MED ORDER — INDOMETHACIN 50 MG RE SUPP
RECTAL | Status: AC
  Filled 2021-12-31: qty 2

## 2021-12-31 MED ORDER — PROPOFOL 10 MG/ML IV BOLUS
INTRAVENOUS | Status: DC | PRN
  Administered 2021-12-31: 100 mg via INTRAVENOUS

## 2021-12-31 NOTE — Anesthesia Preprocedure Evaluation (Addendum)
Anesthesia Evaluation  Patient identified by MRN, date of birth, ID band Patient awake    Reviewed: Allergy & Precautions, NPO status , Patient's Chart, lab work & pertinent test results, reviewed documented beta blocker date and time   History of Anesthesia Complications Negative for: history of anesthetic complications  Airway Mallampati: II  TM Distance: >3 FB Neck ROM: Full    Dental  (+) Dental Advisory Given, Edentulous Lower, Edentulous Upper   Pulmonary neg pulmonary ROS,    Pulmonary exam normal breath sounds clear to auscultation       Cardiovascular hypertension, Pt. on home beta blockers + CAD, + Past MI and + Cardiac Stents  Normal cardiovascular exam Rhythm:Regular Rate:Normal     Neuro/Psych PSYCHIATRIC DISORDERS Anxiety negative neurological ROS     GI/Hepatic GERD  Medicated,Cholangitis, choledocholithiasis   Endo/Other  Hypothyroidism   Renal/GU negative Renal ROS     Musculoskeletal  (+) Arthritis ,   Abdominal   Peds  Hematology negative hematology ROS (+)   Anesthesia Other Findings   Reproductive/Obstetrics                            Anesthesia Physical Anesthesia Plan  ASA: 3  Anesthesia Plan: General   Post-op Pain Management: Minimal or no pain anticipated   Induction: Intravenous  PONV Risk Score and Plan: 2 and Ondansetron and Dexamethasone  Airway Management Planned: Oral ETT  Additional Equipment:   Intra-op Plan:   Post-operative Plan: Extubation in OR  Informed Consent: I have reviewed the patients History and Physical, chart, labs and discussed the procedure including the risks, benefits and alternatives for the proposed anesthesia with the patient or authorized representative who has indicated his/her understanding and acceptance.     Dental advisory given  Plan Discussed with: Anesthesiologist and CRNA  Anesthesia Plan Comments:         Anesthesia Quick Evaluation
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
## 2021-12-31 NOTE — Progress Notes (Signed)
Mobility Specialist Progress Note:   12/31/21 0900  Mobility  Activity Ambulated with assistance in hallway  Level of Assistance Standby assist, set-up cues, supervision of patient - no hands on  Assistive Device None  Distance Ambulated (ft) 550 ft  Activity Response Tolerated well  $Mobility charge 1 Mobility   Pt agreeable to mobility session. Required no physical assist throughout. Pt back in bed with all needs met.   Nelta Numbers [facility] Secure Chat or Office Phone: [phone]

## 2021-12-31 NOTE — Interval H&P Note (Signed)
History and Physical Interval Note:  12/31/2021 12:03 PM  Herbert Carr  has presented today for surgery, with the diagnosis of Cholangitis, choledocholithiasis.  The various methods of treatment have been discussed with the patient and family. After consideration of risks, benefits and other options for treatment, the patient has consented to  Procedure(s): ENDOSCOPIC RETROGRADE CHOLANGIOPANCREATOGRAPHY (ERCP) (N/A) as a surgical intervention.  The patient's history has been reviewed, patient examined, no change in status, stable for surgery.  I have reviewed the patient's chart and labs.  Questions were answered to the patient's satisfaction.     Milus Banister

## 2021-12-31 NOTE — Op Note (Addendum)
[facility] Patient Name: Herbert Carr Procedure Date : 12/31/2021 MRN: [id number] Attending MD: Milus Banister , MD Date of Birth: 1940/07/31 CSN: [id number] Age: 81 Admit Type: Inpatient Procedure:                ERCP Indications:              Cholangitis. ERCP 2022 Dr. Watt Climes, for the same,                            sphincterotomy and balloon sweeping delivered no                            stones into the duodenum however. Lap chole same                            admission, cholecystitis and cholelithiasis Providers:                Milus Banister, MD, Elmer Ramp. Tilden Dome, RN, Cletis Athens, Technician, Paulina Fusi Referring MD:              Medicines:                General Anesthesia, Indomethacin 100 mg PR,                            maxipime in hospital Complications:            No immediate complications. Estimated blood loss:                            None Estimated Blood Loss:     Estimated blood loss: none. Procedure:                Pre-Anesthesia Assessment:                           - Prior to the procedure, a History and Physical                            was performed, and patient medications and                            allergies were reviewed. The patient's tolerance of                            previous anesthesia was also reviewed. The risks                            and benefits of the procedure and the sedation                            options and risks were discussed with the patient.  All questions were answered, and informed consent                            was obtained. Prior Anticoagulants: The patient has                            taken no previous anticoagulant or antiplatelet                            agents. ASA Grade Assessment: III - A patient with                            severe systemic disease. After reviewing the risks                            and benefits, the patient  was deemed in                            satisfactory condition to undergo the procedure.                           After obtaining informed consent, the scope was                            passed under direct vision. Throughout the                            procedure, the patient's blood pressure, pulse, and                            oxygen saturations were monitored continuously. The                            [id number]-Q190V ([id number]) Olympus duodenoscope was                            introduced through the mouth, and used to inject                            contrast into and used to inject contrast into the                            bile duct. The ERCP was accomplished without                            difficulty. The patient tolerated the procedure                            well. Scope In: Scope Out: Findings:      Scout film showed clips in the right upper quadrant. The duodenoscope       was advanced to the region of the major papilla without detailed       examination of the UGI tract. There was purulent discharge at the major  papilla even prior to manipulation. There was evidence of a previous       small biliary sphincterotomy. I used a 15 Autotome over a .035 hydrawire       to cannulate the bile duct and injected contrast. This revealed a       slightly dilated bile duct, filled with amorphous stones. CBD was 7-33m       maximally. Some of the stones advanced into the duodenum simply with       sphincterotome positioning. I did not think that the previous       sphincterotomy site was large enough to pull the remaining stones       through and so I dilated the site with a 857mhurricane biliary dilation       balloon and then swept the duct 15-20 times delivering numerous purulent       brown stones and stone debris into the duodenum. I felt there was a       small amount of remaining, impacted soft stone at the distal end of the       bile duct and so I placed a 10Fr  5cm long plastic stent. The main       pancreatic duct was never cannulated with the wire or injected with dye. Impression:               - Previous biliary sphincterotomy noted.                           - Extensive purulent choledocholithiasis was found                            and treated with balloon dilation of the                            sphincterotomy site, balloon sweeping and then                            stent placement. Recommendation:           - I will allow heart healthy diet today.                           - Should complete another 36 hours of IV                            antibiotics and then 4-5 days of oral antibiotics.                           - He will need eventual repeat ERCP in about 2                            months, my office will help coordinate at discharge. Procedure Code(s):        --- Professional ---                           43616 [id number] retrograde                            cholangiopancreatography (  ERCP); with removal of                            calculi/debris from biliary/pancreatic duct(s)                           43262, Endoscopic retrograde                            cholangiopancreatography (ERCP); with                            sphincterotomy/papillotomy Diagnosis Code(s):        --- Professional ---                           K80.50, Calculus of bile duct without cholangitis                            or cholecystitis without obstruction CPT copyright 2019 [employer]. All rights reserved. The codes documented in this report are preliminary and upon coder review may  be revised to meet current compliance requirements. Milus Banister, MD 12/31/2021 2:26:21 PM This report has been signed electronically. Number of Addenda: 0

## 2021-12-31 NOTE — Anesthesia Procedure Notes (Signed)
Procedure Name: Intubation Date/Time: 12/31/2021 1:15 PM  Performed by: [name], MDPre-anesthesia Checklist: Patient identified, Emergency Drugs available, Suction available and Patient being monitored Patient Re-evaluated:Patient Re-evaluated prior to induction Oxygen Delivery Method: Circle System Utilized Preoxygenation: Pre-oxygenation with 100% oxygen Induction Type: IV induction Ventilation: Two handed mask ventilation required and Oral airway inserted - appropriate to patient size Laryngoscope Size: Mac and 4 Grade View: Grade II Tube type: Oral Tube size: 7.5 mm Number of attempts: 1 Airway Equipment and Method: Stylet and Oral airway Placement Confirmation: ETT inserted through vocal cords under direct vision, positive ETCO2 and breath sounds checked- equal and bilateral Secured at: 22 cm Tube secured with: Tape Dental Injury: Teeth and Oropharynx as per pre-operative assessment

## 2021-12-31 NOTE — Transfer of Care (Signed)
Immediate Anesthesia Transfer of Care Note  Patient: Herbert Carr  Procedure(s) Performed: ENDOSCOPIC RETROGRADE CHOLANGIOPANCREATOGRAPHY (ERCP) BILIARY DILATION REMOVAL OF STONES BILIARY STENT PLACEMENT  Patient Location: PACU  Anesthesia Type:General  Level of Consciousness: awake and alert   Airway & Oxygen Therapy: Patient Spontanous Breathing  Post-op Assessment: Report given to RN and Post -op Vital signs reviewed and stable  Post vital signs: Reviewed and stable  Last Vitals:  Vitals Value Taken Time  BP 117/54 12/31/21 1423  Temp 37.2 C 12/31/21 1423  Pulse 70 12/31/21 1424  Resp 21 12/31/21 1424  SpO2 97 % 12/31/21 1424  Vitals shown include unvalidated device data.  Last Pain:  Vitals:   12/31/21 1423  TempSrc: Temporal  PainSc: 0-No pain         Complications: No notable events documented.

## 2021-12-31 NOTE — Plan of Care (Signed)
Patient ID: KAMDON REISIG, male   DOB: 08/08/1940, 81 y.o.   MRN: [id number]  Problem: Education: Goal: Knowledge of General Education information will improve Description: Including pain rating scale, medication(s)/side effects and non-pharmacologic comfort measures Outcome: Progressing   Problem: Health Behavior/Discharge Planning: Goal: Ability to manage health-related needs will improve Outcome: Progressing   Problem: Clinical Measurements: Goal: Ability to maintain clinical measurements within normal limits will improve Outcome: Progressing Goal: Will remain free from infection Outcome: Progressing Goal: Diagnostic test results will improve Outcome: Progressing Goal: Respiratory complications will improve Outcome: Progressing Goal: Cardiovascular complication will be avoided Outcome: Progressing   Problem: Activity: Goal: Risk for activity intolerance will decrease Outcome: Progressing   Problem: Nutrition: Goal: Adequate nutrition will be maintained Outcome: Progressing   Problem: Coping: Goal: Level of anxiety will decrease Outcome: Progressing   Problem: Elimination: Goal: Will not experience complications related to bowel motility Outcome: Progressing Goal: Will not experience complications related to urinary retention Outcome: Progressing   Problem: Pain Managment: Goal: General experience of comfort will improve Outcome: Progressing   Problem: Safety: Goal: Ability to remain free from injury will improve Outcome: Progressing   Problem: Skin Integrity: Goal: Risk for impaired skin integrity will decrease Outcome: Progressing    Haydee Salter, RN
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
## 2021-12-31 NOTE — Progress Notes (Signed)
PROGRESS NOTE    Herbert Carr  NUU:[id number] DOB: March 04, 1941 DOA: 12/30/2021 PCP: Phelps Nation, MD    Brief Narrative:  81 year old gentleman with history of prostate cancer, hypothyroidism, coronary artery disease, STEMI, chronic thrombocytopenia and history of Klebsiella bacteremia, choledocholithiasis and cholangitis 1 year ago status post lap chole presented to [facility] with intense abdominal and back pain with history of intermittent tolerable abdominal pain since lap chole.  In the emergency room afebrile.  Was hypotensive and tachycardic.  Found to have grossly abnormal LFTs.  CT angiogram of the chest abdomen pelvis with 3 x 3 cm hyperenhancing lesion on the left liver dome.  Mild intra extrahepatic ductal dilatation.  Found to have noncalcified CBD stones.  Transferred to [facility] with GI consultation.   Assessment & Plan:   Choledocholithiasis with severe abdominal pain: Currently hemodynamically stable. N.p.o., adequate IV fluids, IV pain medications, antibiotics with cefepime and Flagyl, blood cultures no growth so far.  Seen by gastroenterology and already scheduled for ERCP today.  Coronary artery disease status post PCI: History of NSTEMI with drug-eluting stent.  Currently no chest pain.  Hold aspirin today.  On metoprolol.  Essential hypertension: Blood pressure stable on metoprolol.   DVT prophylaxis: SCDs Start: 12/30/21 2043   Code Status: Full code Family Communication: Wife at the bedside Disposition Plan: Status is: Inpatient Remains inpatient appropriate because: Inpatient procedures planned     Consultants:  Gastroenterology  Procedures:  ERCP, plan today  Antimicrobials:  Cefepime and Flagyl 7/9--   Subjective: Patient was seen and examined.  Wife at the bedside.  Denies any complaints today.  He is aware about the type of procedure they do.  He wants to eat as soon as possible.  Afebrile  overnight.  Objective: Vitals:   12/30/21 2308 12/31/21 0618 12/31/21 0856 12/31/21 1233  BP: 113/60 (!) 109/55 114/64 (!) 119/58  Pulse: 79 72 67 64  Resp: '18 20 17 16  '$ Temp: 98.7 F (37.1 C) 97.9 F (36.6 C) 98.9 F (37.2 C)   TempSrc: Oral Oral Oral   SpO2: 100% 98% 99% 100%  Weight:      Height:        Intake/Output Summary (Last 24 hours) at 12/31/2021 1305 Last data filed at 12/31/2021 1131 Gross per 24 hour  Intake 2752.8 ml  Output 600 ml  Net 2152.8 ml   Filed Weights   12/30/21 1417  Weight: 72.6 kg    Examination:  General exam: Appears calm and comfortable  Respiratory system: Clear to auscultation. Respiratory effort normal. Cardiovascular system: S1 & S2 heard, RRR.  Gastrointestinal system: Soft.  Nontender.  Bowel sound present. Central nervous system: Alert and oriented. No focal neurological deficits. Extremities: Symmetric 5 x 5 power. Skin: No rashes, lesions or ulcers Psychiatry: Judgement and insight appear normal. Mood & affect appropriate.     Data Reviewed: I have personally reviewed following labs and imaging studies  CBC: Recent Labs  Lab 12/30/21 1421 12/31/21 0132  WBC 17.4* 13.0*  HGB 15.2 11.9*  HCT 43.5 32.6*  MCV 92.8 89.8  PLT 156 347*   Basic Metabolic Panel: Recent Labs  Lab 12/30/21 1421 12/31/21 0132  NA 135 135  K 5.0 3.4*  CL 100 102  CO2 25 23  GLUCOSE 149* 89  BUN 14 11  CREATININE 0.47* 0.72  CALCIUM 9.2 8.4*  MG 2.1  --    GFR: Estimated Creatinine Clearance: 73.1 mL/min (by C-G formula based on  SCr of 0.72 mg/dL). Liver Function Tests: Recent Labs  Lab 12/30/21 1421 12/31/21 0132  AST 609* 251*  ALT 445* 324*  ALKPHOS 195* 136*  BILITOT 4.8* 5.3*  PROT 8.0 5.3*  ALBUMIN 4.6 2.8*   Recent Labs  Lab 12/30/21 1421  LIPASE 40   No results for input(s): "AMMONIA" in the last 168 hours. Coagulation Profile: Recent Labs  Lab 12/30/21 1720  INR 1.2   Cardiac Enzymes: No results for  input(s): "CKTOTAL", "CKMB", "CKMBINDEX", "TROPONINI" in the last 168 hours. BNP (last 3 results) No results for input(s): "PROBNP" in the last 8760 hours. HbA1C: No results for input(s): "HGBA1C" in the last 72 hours. CBG: No results for input(s): "GLUCAP" in the last 168 hours. Lipid Profile: No results for input(s): "CHOL", "HDL", "LDLCALC", "TRIG", "CHOLHDL", "LDLDIRECT" in the last 72 hours. Thyroid Function Tests: No results for input(s): "TSH", "T4TOTAL", "FREET4", "T3FREE", "THYROIDAB" in the last 72 hours. Anemia Panel: No results for input(s): "VITAMINB12", "FOLATE", "FERRITIN", "TIBC", "IRON", "RETICCTPCT" in the last 72 hours. Sepsis Labs: Recent Labs  Lab 12/30/21 1655 12/30/21 1947  LATICACIDVEN 3.0* 1.6    Recent Results (from the past 240 hour(s))  Blood Culture (routine x 2)     Status: None (Preliminary result)   Collection Time: 12/30/21  4:55 PM   Specimen: BLOOD RIGHT FOREARM  Result Value Ref Range Status   Specimen Description   Final    BLOOD RIGHT FOREARM BOTTLES DRAWN AEROBIC AND ANAEROBIC   Special Requests Blood Culture adequate volume  Final   Culture   Final    NO GROWTH < 12 HOURS Performed at [facility], [address], [place], [place] [address]    Report Status PENDING  Incomplete  Blood Culture (routine x 2)     Status: None (Preliminary result)   Collection Time: 12/30/21  5:20 PM   Specimen: BLOOD RIGHT HAND  Result Value Ref Range Status   Specimen Description   Final    BLOOD RIGHT HAND BOTTLES DRAWN AEROBIC AND ANAEROBIC   Special Requests Blood Culture adequate volume  Final   Culture   Final    NO GROWTH < 12 HOURS Performed at [facility], [address] [place], [place] [address]    Report Status PENDING  Incomplete         Radiology Studies: CT Angio Chest/Abd/Pel for Dissection W and/or Wo Contrast  Result Date: 12/30/2021 CLINICAL DATA:  Acute aortic syndrome abdominal pain. EXAM: CT ANGIOGRAPHY CHEST, ABDOMEN AND  PELVIS TECHNIQUE: Non-contrast CT of the chest was initially obtained. Multidetector CT imaging through the chest, abdomen and pelvis was performed using the standard protocol during bolus administration of intravenous contrast. Multiplanar reconstructed images and MIPs were obtained and reviewed to evaluate the vascular anatomy. RADIATION DOSE REDUCTION: This exam was performed according to the departmental dose-optimization program which includes automated exposure control, adjustment of the mA and/or kV according to patient size and/or use of iterative reconstruction technique. CONTRAST:  151m OMNIPAQUE IOHEXOL 350 MG/ML SOLN COMPARISON:  Chest CTA 03/25/2017.  CT stone study 12/12/2020. FINDINGS: CTA CHEST FINDINGS Cardiovascular: Pre contrast imaging shows no hyperdense crescent in the wall of the thoracic aorta to suggest the presence of an acute intramural hematoma. No thoracic aortic aneurysm no dissection of the thoracic aorta. No large central pulmonary embolus in the main or lobar pulmonary arteries Mediastinum/Nodes: No mediastinal lymphadenopathy. There is no hilar lymphadenopathy. The esophagus has normal imaging features. There is no axillary lymphadenopathy. Lungs/Pleura: No suspicious  pulmonary nodule or mass. No focal airspace consolidation. No pleural effusion. No evidence for pneumothorax. Musculoskeletal: No worrisome lytic or sclerotic osseous abnormality. Review of the MIP images confirms the above findings. CTA ABDOMEN AND PELVIS FINDINGS VASCULAR Aorta: Normal caliber aorta without aneurysm, dissection, vasculitis or significant stenosis. There is moderate atherosclerotic calcification of the abdominal aorta. Celiac: Patent without evidence of aneurysm, dissection, vasculitis or significant stenosis. SMA: Patent without evidence of aneurysm, dissection, vasculitis or significant stenosis. Replaced common hepatic artery. Renals: Both renal arteries are patent without evidence of aneurysm,  dissection, vasculitis, fibromuscular dysplasia or significant stenosis. Accessory right renal artery noted. IMA: Patent without evidence of aneurysm, dissection, vasculitis or significant stenosis. Inflow: Patent without evidence of aneurysm, dissection, vasculitis or significant stenosis. Veins: No obvious venous abnormality within the limitations of this arterial phase study. Review of the MIP images confirms the above findings. NON-VASCULAR Hepatobiliary: 3.2 x 2.9 cm subtle hyperenhancing lesion identified in the dome of the left liver (image 87/6 and coronal 60/10). Mild intrahepatic biliary duct dilatation is progressive since chest CTA 03/25/2017. Previous abdomen CT was performed without intravenous contrast, limiting comparison. Gallbladder surgically absent. Common bile duct measures up to 12 mm diameter. 2 adjacent uncalcified filling defects are seen in the distal common bile duct on coronal imaging (see coronal image 70/series 10. There is an additional filling defect right at the ampulla the contains gas, suggesting cholesterol stone (see axial image 129/6 and coronal 70/10). Pancreas: No focal mass lesion. No dilatation of the main duct. No intraparenchymal cyst. No peripancreatic edema. Spleen: No splenomegaly. No focal mass lesion. Adrenals/Urinary Tract: 14 mm right adrenal nodule cannot be definitively characterized but is stable since chest CTA 03/25/2017. Thickening of the left adrenal gland evident. Kidneys unremarkable. No evidence for hydroureter. The urinary bladder appears normal for the degree of distention. Stomach/Bowel: Stomach is unremarkable. No gastric wall thickening. No evidence of outlet obstruction. Duodenum is normally positioned as is the ligament of Treitz. No small bowel wall thickening. No small bowel dilatation. The terminal ileum is normal. The appendix is normal. No gross colonic mass. No colonic wall thickening. Lymphatic: There is no gastrohepatic or hepatoduodenal  ligament lymphadenopathy. No retroperitoneal or mesenteric lymphadenopathy. No pelvic sidewall lymphadenopathy. Reproductive: Brachytherapy seeds noted in the prostate gland. Other: No intraperitoneal free fluid. Musculoskeletal: No worrisome lytic or sclerotic osseous abnormality. Review of the MIP images confirms the above findings. IMPRESSION: 1. No evidence for acute intramural hematoma or dissection in the thoracoabdominal aorta. No thoracoabdominal aortic aneurysm. 2. 3.2 x 2.9 cm subtle hyperenhancing lesion in the dome of the left liver. MRI/MRCP with and without contrast recommended to further evaluate. 3. Mild to moderate intra and extrahepatic biliary duct dilatation with 3 noncalcified distal common bile duct stones evident by CT. Correlation with liver function test suggested. This could also be further evaluated at MRI/MRCP. 4. 14 mm right adrenal nodule, stable since 2018, most likely benign. 5. Aortic Atherosclerosis (ICD10-I70.0). Electronically Signed   By: Misty Stanley M.D.   On: 12/30/2021 16:00        Scheduled Meds:  [MAR Hold] aspirin EC  81 mg Oral Daily   [MAR Hold] levothyroxine  100 mcg Oral QAC breakfast   [MAR Hold] metoprolol succinate  25 mg Oral Daily   [MAR Hold] pantoprazole (PROTONIX) IV  40 mg Intravenous Q24H   Continuous Infusions:  sodium chloride     [MAR Hold] ceFEPime (MAXIPIME) IV Stopped (12/30/21 1748)   [MAR Hold] ceFEPime (MAXIPIME) IV Stopped (  12/31/21 1238)   lactated ringers Stopped (12/31/21 1238)   lactated ringers 20 mL/hr at 12/31/21 1240   [MAR Hold] metronidazole 500 mg (12/31/21 0543)     LOS: 1 day    Time spent: 35 minutes    Barb Merino, MD [facility] Pager [phone]

## 2022-01-01 ENCOUNTER — Encounter (HOSPITAL_COMMUNITY): Payer: Self-pay | Admitting: Gastroenterology

## 2022-01-01 DIAGNOSIS — K8050 Calculus of bile duct without cholangitis or cholecystitis without obstruction: Secondary | ICD-10-CM | POA: Diagnosis not present

## 2022-01-01 DIAGNOSIS — R748 Abnormal levels of other serum enzymes: Secondary | ICD-10-CM | POA: Diagnosis not present

## 2022-01-01 DIAGNOSIS — K8309 Other cholangitis: Secondary | ICD-10-CM | POA: Diagnosis not present

## 2022-01-01 LAB — COMPREHENSIVE METABOLIC PANEL
ALT: 205 U/L — ABNORMAL HIGH (ref 0–44)
AST: 91 U/L — ABNORMAL HIGH (ref 15–41)
Albumin: 2.6 g/dL — ABNORMAL LOW (ref 3.5–5.0)
Alkaline Phosphatase: 117 U/L (ref 38–126)
Anion gap: 7 (ref 5–15)
BUN: 17 mg/dL (ref 8–23)
CO2: 24 mmol/L (ref 22–32)
Calcium: 8.2 mg/dL — ABNORMAL LOW (ref 8.9–10.3)
Chloride: 103 mmol/L (ref 98–111)
Creatinine, Ser: 0.87 mg/dL (ref 0.61–1.24)
GFR, Estimated: >60 mL/min (ref >60)
Glucose, Bld: 176 mg/dL — ABNORMAL HIGH (ref 70–99)
Potassium: 4 mmol/L (ref 3.5–5.1)
Sodium: 134 mmol/L — ABNORMAL LOW (ref 135–145)
Total Bilirubin: 5.2 mg/dL — ABNORMAL HIGH (ref 0.3–1.2)
Total Protein: 5 g/dL — ABNORMAL LOW (ref 6.5–8.1)

## 2022-01-01 MED ORDER — SODIUM CHLORIDE 0.9 % IV SOLN
2.0000 g | Freq: Once | INTRAVENOUS | Status: AC
  Administered 2022-01-01: 2 g via INTRAVENOUS
  Filled 2022-01-01: qty 12.5

## 2022-01-01 MED ORDER — METRONIDAZOLE 500 MG/100ML IV SOLN
500.0000 mg | Freq: Once | INTRAVENOUS | Status: AC
  Administered 2022-01-01: 500 mg via INTRAVENOUS
  Filled 2022-01-01: qty 100

## 2022-01-01 MED ORDER — AMOXICILLIN-POT CLAVULANATE 875-125 MG PO TABS: 1.0000 | ORAL_TABLET | Freq: Two times a day (BID) | ORAL | 0 refills | Status: AC

## 2022-01-01 NOTE — Discharge Summary (Signed)
Physician Discharge Summary  Herbert Carr VOH:[id number] DOB: 1941-04-07 DOA: 12/30/2021  PCP: [place] Nation, MD  Admit date: 12/30/2021 Discharge date: 01/01/2022  Admitted From: Home Disposition: Home  Recommendations for Outpatient Follow-up:  Follow up with PCP in 1-2 weeks Please obtain c-Met in 1 week Gastroenterology will schedule follow-up  Home Health: N/A Equipment/Devices: N/A  Discharge Condition: Stable CODE STATUS: Full code Diet recommendation: Heart healthy diet  Discharge summary: 81 year old gentleman with history of prostate cancer, hypothyroidism, coronary artery disease, STEMI, chronic thrombocytopenia and history of Klebsiella bacteremia, choledocholithiasis and cholangitis 1 year ago status post lap chole presented to [facility] with intense abdominal and back pain with history of intermittent tolerable abdominal pain since lap chole.  In the emergency room afebrile.  Was hypotensive and tachycardic.  Found to have grossly abnormal LFTs.  CT angiogram of the chest abdomen pelvis with 3 x 3 cm hyperenhancing lesion on the left liver dome.  Mild intra extrahepatic ductal dilatation.  Found to have noncalcified CBD stones.  Transferred to [facility] with GI consultation.   # Choledocholithiasis, cholangitis with severe abdominal pain: Patient is status post cholecystectomy 1 year ago. ERCP, sphincterotomy and balloon dilatation and plastic CBD stent placement biliary duct 7/10. Symptoms are well controlled today. Asymptomatic.  Tolerating regular diet.  No pain.  Blood cultures negative so far. Treated with cefepime and Flagyl. LFTs still up but clinically improving. He will continue IV antibiotics all day today and as per GI recommendation go home on Augmentin that he will start tomorrow for additional 5 days. We will need repeat LFTs and repeat ERCP that will be arranged by GI office.  Coronary artery disease status post PCI: History of  NSTEMI with drug-eluting stent.  Currently no chest pain.  On aspirin and metoprolol.   Essential hypertension: Blood pressure stable on metoprolol.  Patient is fairly stable.  He will be able to go home today after completing evening dose of IV antibiotics.   Discharge Diagnoses:  Principal Problem:   Choledocholithiasis Active Problems:   Acute cholangitis   Essential hypertension   Hypothyroidism   CAD S/P PCI   History of prostate cancer   Blind left eye   Elevated liver enzymes    Discharge Instructions  Discharge Instructions     Call MD for:  severe uncontrolled pain   Complete by: As directed    Call MD for:  temperature >100.4   Complete by: As directed    Diet - low sodium heart healthy   Complete by: As directed    Increase activity slowly   Complete by: As directed       Allergies as of 01/01/2022   No Known Allergies      Medication List     STOP taking these medications    metoprolol tartrate 25 MG tablet Commonly known as: LOPRESSOR       TAKE these medications    amoxicillin-clavulanate 875-125 MG tablet Commonly known as: AUGMENTIN Take 1 tablet by mouth 2 (two) times daily for 5 days. Start taking on: January 02, 2022   aspirin EC 81 MG tablet Take 1 tablet (81 mg total) by mouth daily.   clonazePAM 0.5 MG tablet Commonly known as: KLONOPIN Take 0.5 mg by mouth at bedtime as needed (sleep).   levothyroxine 100 MCG tablet Commonly known as: SYNTHROID Take 100 mcg by mouth daily before breakfast.   metoprolol succinate 25 MG 24 hr tablet Commonly known as: TOPROL-XL Take  25 mg by mouth daily.   nitroGLYCERIN 0.4 MG SL tablet Commonly known as: Nitrostat Place 1 tablet (0.4 mg total) under the tongue every 5 (five) minutes as needed.   pantoprazole 20 MG tablet Commonly known as: PROTONIX Take 20 mg by mouth daily.   simethicone 80 MG chewable tablet Commonly known as: MYLICON TAKE 1 TABLET BY MOUTH EVERY 6 HOURS AS NEEDED  FOR BLOATING. What changed: See the new instructions.   triamcinolone ointment 0.1 % Commonly known as: KENALOG Apply 1 application topically at bedtime.   vitamin B-12 500 MCG tablet Commonly known as: CYANOCOBALAMIN Take 500 mcg by mouth in the morning.        Follow-up Information     [place] Nation, MD Follow up in 1 week(s).   Specialty: Internal Medicine Why: lab work at MD office on tuesday or wed next week.  they are aware. Contact information: [place] [address] [phone]                No Known Allergies  Consultations: Gastroenterology   Procedures/Studies: DG ERCP  Result Date: 12/31/2021 CLINICAL DATA:  ERCP EXAM: ERCP TECHNIQUE: Multiple spot images obtained with the fluoroscopic device and submitted for interpretation post-procedure. FLUOROSCOPY TIME: FLUOROSCOPY TIME 5 minutes, 56 seconds (31.7 mGy) COMPARISON:  CT abdomen and pelvis-12/30/2021 ERCP-12/13/2020 FINDINGS: Five spot intraoperative fluoroscopic images the right upper abdominal quadrant during ERCP are provided for review Initial image demonstrates an ERCP probe overlying the right upper abdominal quadrant. Surgical clips overlie the gallbladder fossa. There is selective cannulation and opacification of the CBD which appears moderately dilated. Several nonocclusive filling defects are seen within the mid and distal aspects of the CBD (image 1) Subsequent images demonstrate insufflation of a balloon within the distal aspect of the CBD. Subsequent images demonstrate insufflation of a balloon within distal aspect of the CBD with subsequent presumed biliary weeping and stone/sludge extraction. Completion image demonstrates placement of a stent overlying the expected location of the distal aspect of the CBD. There is minimal opacification of the intrahepatic biliary tree which appears mildly dilated. There is no definitive opacification of the cystic or pancreatic ducts.  IMPRESSION: ERCP with suspected choledocholithiasis with subsequent biliary sweeping and biliary stent placement. These images were submitted for radiologic interpretation only. Please see the procedural report for the amount of contrast and the fluoroscopy time utilized. Electronically Signed   By: Sandi Mariscal M.D.   On: 12/31/2021 16:14   CT Angio Chest/Abd/Pel for Dissection W and/or Wo Contrast  Result Date: 12/30/2021 CLINICAL DATA:  Acute aortic syndrome abdominal pain. EXAM: CT ANGIOGRAPHY CHEST, ABDOMEN AND PELVIS TECHNIQUE: Non-contrast CT of the chest was initially obtained. Multidetector CT imaging through the chest, abdomen and pelvis was performed using the standard protocol during bolus administration of intravenous contrast. Multiplanar reconstructed images and MIPs were obtained and reviewed to evaluate the vascular anatomy. RADIATION DOSE REDUCTION: This exam was performed according to the departmental dose-optimization program which includes automated exposure control, adjustment of the mA and/or kV according to patient size and/or use of iterative reconstruction technique. CONTRAST:  1107m OMNIPAQUE IOHEXOL 350 MG/ML SOLN COMPARISON:  Chest CTA 03/25/2017.  CT stone study 12/12/2020. FINDINGS: CTA CHEST FINDINGS Cardiovascular: Pre contrast imaging shows no hyperdense crescent in the wall of the thoracic aorta to suggest the presence of an acute intramural hematoma. No thoracic aortic aneurysm no dissection of the thoracic aorta. No large central pulmonary embolus in the main or lobar pulmonary  arteries Mediastinum/Nodes: No mediastinal lymphadenopathy. There is no hilar lymphadenopathy. The esophagus has normal imaging features. There is no axillary lymphadenopathy. Lungs/Pleura: No suspicious pulmonary nodule or mass. No focal airspace consolidation. No pleural effusion. No evidence for pneumothorax. Musculoskeletal: No worrisome lytic or sclerotic osseous abnormality. Review of the MIP  images confirms the above findings. CTA ABDOMEN AND PELVIS FINDINGS VASCULAR Aorta: Normal caliber aorta without aneurysm, dissection, vasculitis or significant stenosis. There is moderate atherosclerotic calcification of the abdominal aorta. Celiac: Patent without evidence of aneurysm, dissection, vasculitis or significant stenosis. SMA: Patent without evidence of aneurysm, dissection, vasculitis or significant stenosis. Replaced common hepatic artery. Renals: Both renal arteries are patent without evidence of aneurysm, dissection, vasculitis, fibromuscular dysplasia or significant stenosis. Accessory right renal artery noted. IMA: Patent without evidence of aneurysm, dissection, vasculitis or significant stenosis. Inflow: Patent without evidence of aneurysm, dissection, vasculitis or significant stenosis. Veins: No obvious venous abnormality within the limitations of this arterial phase study. Review of the MIP images confirms the above findings. NON-VASCULAR Hepatobiliary: 3.2 x 2.9 cm subtle hyperenhancing lesion identified in the dome of the left liver (image 87/6 and coronal 60/10). Mild intrahepatic biliary duct dilatation is progressive since chest CTA 03/25/2017. Previous abdomen CT was performed without intravenous contrast, limiting comparison. Gallbladder surgically absent. Common bile duct measures up to 12 mm diameter. 2 adjacent uncalcified filling defects are seen in the distal common bile duct on coronal imaging (see coronal image 70/series 10. There is an additional filling defect right at the ampulla the contains gas, suggesting cholesterol stone (see axial image 129/6 and coronal 70/10). Pancreas: No focal mass lesion. No dilatation of the main duct. No intraparenchymal cyst. No peripancreatic edema. Spleen: No splenomegaly. No focal mass lesion. Adrenals/Urinary Tract: 14 mm right adrenal nodule cannot be definitively characterized but is stable since chest CTA 03/25/2017. Thickening of the left  adrenal gland evident. Kidneys unremarkable. No evidence for hydroureter. The urinary bladder appears normal for the degree of distention. Stomach/Bowel: Stomach is unremarkable. No gastric wall thickening. No evidence of outlet obstruction. Duodenum is normally positioned as is the ligament of Treitz. No small bowel wall thickening. No small bowel dilatation. The terminal ileum is normal. The appendix is normal. No gross colonic mass. No colonic wall thickening. Lymphatic: There is no gastrohepatic or hepatoduodenal ligament lymphadenopathy. No retroperitoneal or mesenteric lymphadenopathy. No pelvic sidewall lymphadenopathy. Reproductive: Brachytherapy seeds noted in the prostate gland. Other: No intraperitoneal free fluid. Musculoskeletal: No worrisome lytic or sclerotic osseous abnormality. Review of the MIP images confirms the above findings. IMPRESSION: 1. No evidence for acute intramural hematoma or dissection in the thoracoabdominal aorta. No thoracoabdominal aortic aneurysm. 2. 3.2 x 2.9 cm subtle hyperenhancing lesion in the dome of the left liver. MRI/MRCP with and without contrast recommended to further evaluate. 3. Mild to moderate intra and extrahepatic biliary duct dilatation with 3 noncalcified distal common bile duct stones evident by CT. Correlation with liver function test suggested. This could also be further evaluated at MRI/MRCP. 4. 14 mm right adrenal nodule, stable since 2018, most likely benign. 5. Aortic Atherosclerosis (ICD10-I70.0). Electronically Signed   By: Misty Stanley M.D.   On: 12/30/2021 16:00   (Echo, Carotid, EGD, Colonoscopy, ERCP)    Subjective: Patient seen and examined.  No overnight events.  He does not have any pain or symptoms since he initially admitted.  Eager to go home.  Could not get any sleep in the hospital.  Cracking jokes.   Discharge Exam: Vitals:  01/01/22 0416 01/01/22 0948  BP: 112/64 (!) 140/56  Pulse: (!) 50 (!) 57  Resp: 17 18  Temp: 97.6 F  (36.4 C) (!) 97.5 F (36.4 C)  SpO2: 99% 100%   Vitals:   12/31/21 1529 12/31/21 2217 01/01/22 0416 01/01/22 0948  BP: (!) 117/55 125/68 112/64 (!) 140/56  Pulse: (!) 53 (!) 54 (!) 50 (!) 57  Resp: 16 18 17 18   Temp: 98.5 F (36.9 C) 98.6 F (37 C) 97.6 F (36.4 C) (!) 97.5 F (36.4 C)  TempSrc: Oral  Oral Oral  SpO2: 98% 100% 99% 100%  Weight:      Height:        General: Pt is alert, awake, not in acute distress Cardiovascular: RRR, S1/S2 +, no rubs, no gallops Respiratory: CTA bilaterally, no wheezing, no rhonchi Abdominal: Soft, NT, ND, bowel sounds + Extremities: no edema, no cyanosis    The results of significant diagnostics from this hospitalization (including imaging, microbiology, ancillary and laboratory) are listed below for reference.     Microbiology: Recent Results (from the past 240 hour(s))  Blood Culture (routine x 2)     Status: None (Preliminary result)   Collection Time: 12/30/21  4:55 PM   Specimen: BLOOD RIGHT FOREARM  Result Value Ref Range Status   Specimen Description   Final    BLOOD RIGHT FOREARM BOTTLES DRAWN AEROBIC AND ANAEROBIC   Special Requests Blood Culture adequate volume  Final   Culture   Final    NO GROWTH 2 DAYS Performed at [facility], [address] [place], [place] [address]    Report Status PENDING  Incomplete  Blood Culture (routine x 2)     Status: None (Preliminary result)   Collection Time: 12/30/21  5:20 PM   Specimen: BLOOD RIGHT HAND  Result Value Ref Range Status   Specimen Description   Final    BLOOD RIGHT HAND BOTTLES DRAWN AEROBIC AND ANAEROBIC   Special Requests Blood Culture adequate volume  Final   Culture   Final    NO GROWTH 2 DAYS Performed at [facility], [address] [place], [place] [address]    Report Status PENDING  Incomplete     Labs: BNP (last 3 results) No results for input(s): "BNP" in the last 8760 hours. Basic Metabolic Panel: Recent Labs  Lab 12/30/21 1421 12/31/21 0132  01/01/22 0141  NA 135 135 134*  K 5.0 3.4* 4.0  CL 100 102 103  CO2 25 23 24   GLUCOSE 149* 89 176*  BUN 14 11 17   CREATININE 0.47* 0.72 0.87  CALCIUM 9.2 8.4* 8.2*  MG 2.1  --   --    Liver Function Tests: Recent Labs  Lab 12/30/21 1421 12/31/21 0132 01/01/22 0141  AST 609* 251* 91*  ALT 445* 324* 205*  ALKPHOS 195* 136* 117  BILITOT 4.8* 5.3* 5.2*  PROT 8.0 5.3* 5.0*  ALBUMIN 4.6 2.8* 2.6*   Recent Labs  Lab 12/30/21 1421  LIPASE 40   No results for input(s): "AMMONIA" in the last 168 hours. CBC: Recent Labs  Lab 12/30/21 1421 12/31/21 0132  WBC 17.4* 13.0*  HGB 15.2 11.9*  HCT 43.5 32.6*  MCV 92.8 89.8  PLT 156 121*   Cardiac Enzymes: No results for input(s): "CKTOTAL", "CKMB", "CKMBINDEX", "TROPONINI" in the last 168 hours. BNP: Invalid input(s): "POCBNP" CBG: No results for input(s): "GLUCAP" in the last 168 hours. D-Dimer No results for input(s): "DDIMER" in the last 72 hours. Hgb A1c  No results for input(s): "HGBA1C" in the last 72 hours. Lipid Profile No results for input(s): "CHOL", "HDL", "LDLCALC", "TRIG", "CHOLHDL", "LDLDIRECT" in the last 72 hours. Thyroid function studies No results for input(s): "TSH", "T4TOTAL", "T3FREE", "THYROIDAB" in the last 72 hours.  Invalid input(s): "FREET3" Anemia work up No results for input(s): "VITAMINB12", "FOLATE", "FERRITIN", "TIBC", "IRON", "RETICCTPCT" in the last 72 hours. Urinalysis    Component Value Date/Time   COLORURINE YELLOW 12/30/2021 1653   APPEARANCEUR CLEAR 12/30/2021 1653   LABSPEC 1.040 (H) 12/30/2021 1653   PHURINE 6.0 12/30/2021 1653   GLUCOSEU NEGATIVE 12/30/2021 1653   HGBUR NEGATIVE 12/30/2021 1653   BILIRUBINUR NEGATIVE 12/30/2021 1653   KETONESUR 5 (A) 12/30/2021 1653   PROTEINUR NEGATIVE 12/30/2021 1653   NITRITE NEGATIVE 12/30/2021 1653   LEUKOCYTESUR NEGATIVE 12/30/2021 1653   Sepsis Labs Recent Labs  Lab 12/30/21 1421 12/31/21 0132  WBC 17.4* 13.0*    Microbiology Recent Results (from the past 240 hour(s))  Blood Culture (routine x 2)     Status: None (Preliminary result)   Collection Time: 12/30/21  4:55 PM   Specimen: BLOOD RIGHT FOREARM  Result Value Ref Range Status   Specimen Description   Final    BLOOD RIGHT FOREARM BOTTLES DRAWN AEROBIC AND ANAEROBIC   Special Requests Blood Culture adequate volume  Final   Culture   Final    NO GROWTH 2 DAYS Performed at [facility], [address], [place], [place] [address]    Report Status PENDING  Incomplete  Blood Culture (routine x 2)     Status: None (Preliminary result)   Collection Time: 12/30/21  5:20 PM   Specimen: BLOOD RIGHT HAND  Result Value Ref Range Status   Specimen Description   Final    BLOOD RIGHT HAND BOTTLES DRAWN AEROBIC AND ANAEROBIC   Special Requests Blood Culture adequate volume  Final   Culture   Final    NO GROWTH 2 DAYS Performed at [facility], [address], [place], [place] [address]    Report Status PENDING  Incomplete     Time coordinating discharge: 35 minutes  SIGNED:   Barb Merino, MD  [facility] 01/01/2022, 2:31 PM

## 2022-01-01 NOTE — Progress Notes (Addendum)
Attending physician's note   I have taken a history, reviewed the chart, and examined the patient. I performed a substantive portion of this encounter, including complete performance of at least one of the key components, in conjunction with the APP. I agree with the APP's note, impression, and recommendations with my edits.   ERCP completed yesterday with extension of previous sphincterotomy by balloon dilation followed by numerous sweeps with removal of numerous stones then placement of 10 French by 5 cm plastic CBD stent.  Patient feeling well and offers no complaints.  Liver enzymes improving.  Patient very hopeful for discharge home later today.  Discussed with Dr. Ardis Hughs from the advanced biliary service, and feel this is likely ok to DC home later today.  - Transition to Augmentin to continue for the next 5 days - Repeat liver enzymes as outpatient in 7 days - Plan for repeat ERCP in about 8 weeks - We will coordinate follow-up in the GI clinic  [facility], [place], [place] [phone] office                Daily Rounding Note  01/01/2022, 11:37 AM  LOS: 2 days   SUBJECTIVE:  cholangitis.  Choledocholithiasis.  Patient feels well and really wants to go home.  Tolerating solid food.  No abdominal pain.  No nausea or vomiting.  As he was yesterday, he is frustrated by having to interact with hospitals and medical providers.  When I mentioned that he would need to have another ERCP in a couple of months he expressed unwillingness to proceed with that.  I did not argue the point.  Told him that he would need antibiotics for about 5 days after discharge.  OBJECTIVE:         Vital signs in last 24 hours:    Temp:  [97.5 F (36.4 C)-99 F (37.2 C)] 97.5 F (36.4 C) (07/11 0948) Pulse Rate:  [50-71] 57 (07/11 0948) Resp:  [16-23] 18 (07/11 0948) BP: (112-140)/(54-68) 140/56 (07/11 0948) SpO2:  [95 %-100 %] 100 % (07/11  0948) Last BM Date : 12/30/21 Filed Weights   12/30/21 1417  Weight: 72.6 kg   General: Looks comfortable, not toxic or ill looking. Heart: RRR Chest: Clear Abdomen: Soft without tenderness Extremities: No CCE Neuro/Psych: Alert.  Oriented x3.  No obvious deficits.  Intake/Output from previous day: 07/10 0701 - 07/11 0700 In: 1120 [P.O.:120; I.V.:700; IV Piggyback:300] Out: 300 [Urine:300]  Intake/Output this shift: Total I/O In: 240 [P.O.:240] Out: -   Lab Results: Recent Labs    12/30/21 1421 12/31/21 0132  WBC 17.4* 13.0*  HGB 15.2 11.9*  HCT 43.5 32.6*  PLT 156 121*   BMET Recent Labs    12/30/21 1421 12/31/21 0132 01/01/22 0141  NA 135 135 134*  K 5.0 3.4* 4.0  CL 100 102 103  CO2 '25 23 24  '$ GLUCOSE 149* 89 176*  BUN '14 11 17  '$ CREATININE 0.47* 0.72 0.87  CALCIUM 9.2 8.4* 8.2*   LFT Recent Labs    12/30/21 1421 12/31/21 0132 01/01/22 0141  PROT 8.0 5.3* 5.0*  ALBUMIN 4.6 2.8* 2.6*  AST 609* 251* 91*  ALT 445* 324* 205*  ALKPHOS 195* 136* 117  BILITOT 4.8* 5.3* 5.2*   PT/INR Recent Labs    12/30/21 1720  LABPROT 15.1  INR 1.2   Hepatitis Panel No results for input(s): "HEPBSAG", "HCVAB", "HEPAIGM", "HEPBIGM" in the last 72 hours.  Studies/Results: DG ERCP  Result Date: 12/31/2021  CLINICAL DATA:  ERCP EXAM: ERCP TECHNIQUE: Multiple spot images obtained with the fluoroscopic device and submitted for interpretation post-procedure. FLUOROSCOPY TIME: FLUOROSCOPY TIME 5 minutes, 56 seconds (31.7 mGy) COMPARISON:  CT abdomen and pelvis-12/30/2021 ERCP-12/13/2020 FINDINGS: Five spot intraoperative fluoroscopic images the right upper abdominal quadrant during ERCP are provided for review Initial image demonstrates an ERCP probe overlying the right upper abdominal quadrant. Surgical clips overlie the gallbladder fossa. There is selective cannulation and opacification of the CBD which appears moderately dilated. Several nonocclusive filling defects are  seen within the mid and distal aspects of the CBD (image 1) Subsequent images demonstrate insufflation of a balloon within the distal aspect of the CBD. Subsequent images demonstrate insufflation of a balloon within distal aspect of the CBD with subsequent presumed biliary weeping and stone/sludge extraction. Completion image demonstrates placement of a stent overlying the expected location of the distal aspect of the CBD. There is minimal opacification of the intrahepatic biliary tree which appears mildly dilated. There is no definitive opacification of the cystic or pancreatic ducts. IMPRESSION: ERCP with suspected choledocholithiasis with subsequent biliary sweeping and biliary stent placement. These images were submitted for radiologic interpretation only. Please see the procedural report for the amount of contrast and the fluoroscopy time utilized. Electronically Signed   By: Sandi Mariscal M.D.   On: 12/31/2021 16:14   CT Angio Chest/Abd/Pel for Dissection W and/or Wo Contrast  Result Date: 12/30/2021 CLINICAL DATA:  Acute aortic syndrome abdominal pain. EXAM: CT ANGIOGRAPHY CHEST, ABDOMEN AND PELVIS TECHNIQUE: Non-contrast CT of the chest was initially obtained. Multidetector CT imaging through the chest, abdomen and pelvis was performed using the standard protocol during bolus administration of intravenous contrast. Multiplanar reconstructed images and MIPs were obtained and reviewed to evaluate the vascular anatomy. RADIATION DOSE REDUCTION: This exam was performed according to the departmental dose-optimization program which includes automated exposure control, adjustment of the mA and/or kV according to patient size and/or use of iterative reconstruction technique. CONTRAST:  113m OMNIPAQUE IOHEXOL 350 MG/ML SOLN COMPARISON:  Chest CTA 03/25/2017.  CT stone study 12/12/2020. FINDINGS: CTA CHEST FINDINGS Cardiovascular: Pre contrast imaging shows no hyperdense crescent in the wall of the thoracic aorta to  suggest the presence of an acute intramural hematoma. No thoracic aortic aneurysm no dissection of the thoracic aorta. No large central pulmonary embolus in the main or lobar pulmonary arteries Mediastinum/Nodes: No mediastinal lymphadenopathy. There is no hilar lymphadenopathy. The esophagus has normal imaging features. There is no axillary lymphadenopathy. Lungs/Pleura: No suspicious pulmonary nodule or mass. No focal airspace consolidation. No pleural effusion. No evidence for pneumothorax. Musculoskeletal: No worrisome lytic or sclerotic osseous abnormality. Review of the MIP images confirms the above findings. CTA ABDOMEN AND PELVIS FINDINGS VASCULAR Aorta: Normal caliber aorta without aneurysm, dissection, vasculitis or significant stenosis. There is moderate atherosclerotic calcification of the abdominal aorta. Celiac: Patent without evidence of aneurysm, dissection, vasculitis or significant stenosis. SMA: Patent without evidence of aneurysm, dissection, vasculitis or significant stenosis. Replaced common hepatic artery. Renals: Both renal arteries are patent without evidence of aneurysm, dissection, vasculitis, fibromuscular dysplasia or significant stenosis. Accessory right renal artery noted. IMA: Patent without evidence of aneurysm, dissection, vasculitis or significant stenosis. Inflow: Patent without evidence of aneurysm, dissection, vasculitis or significant stenosis. Veins: No obvious venous abnormality within the limitations of this arterial phase study. Review of the MIP images confirms the above findings. NON-VASCULAR Hepatobiliary: 3.2 x 2.9 cm subtle hyperenhancing lesion identified in the dome of the left liver (image 87/6 and  coronal 60/10). Mild intrahepatic biliary duct dilatation is progressive since chest CTA 03/25/2017. Previous abdomen CT was performed without intravenous contrast, limiting comparison. Gallbladder surgically absent. Common bile duct measures up to 12 mm diameter. 2  adjacent uncalcified filling defects are seen in the distal common bile duct on coronal imaging (see coronal image 70/series 10. There is an additional filling defect right at the ampulla the contains gas, suggesting cholesterol stone (see axial image 129/6 and coronal 70/10). Pancreas: No focal mass lesion. No dilatation of the main duct. No intraparenchymal cyst. No peripancreatic edema. Spleen: No splenomegaly. No focal mass lesion. Adrenals/Urinary Tract: 14 mm right adrenal nodule cannot be definitively characterized but is stable since chest CTA 03/25/2017. Thickening of the left adrenal gland evident. Kidneys unremarkable. No evidence for hydroureter. The urinary bladder appears normal for the degree of distention. Stomach/Bowel: Stomach is unremarkable. No gastric wall thickening. No evidence of outlet obstruction. Duodenum is normally positioned as is the ligament of Treitz. No small bowel wall thickening. No small bowel dilatation. The terminal ileum is normal. The appendix is normal. No gross colonic mass. No colonic wall thickening. Lymphatic: There is no gastrohepatic or hepatoduodenal ligament lymphadenopathy. No retroperitoneal or mesenteric lymphadenopathy. No pelvic sidewall lymphadenopathy. Reproductive: Brachytherapy seeds noted in the prostate gland. Other: No intraperitoneal free fluid. Musculoskeletal: No worrisome lytic or sclerotic osseous abnormality. Review of the MIP images confirms the above findings. IMPRESSION: 1. No evidence for acute intramural hematoma or dissection in the thoracoabdominal aorta. No thoracoabdominal aortic aneurysm. 2. 3.2 x 2.9 cm subtle hyperenhancing lesion in the dome of the left liver. MRI/MRCP with and without contrast recommended to further evaluate. 3. Mild to moderate intra and extrahepatic biliary duct dilatation with 3 noncalcified distal common bile duct stones evident by CT. Correlation with liver function test suggested. This could also be further  evaluated at MRI/MRCP. 4. 14 mm right adrenal nodule, stable since 2018, most likely benign. 5. Aortic Atherosclerosis (ICD10-I70.0). Electronically Signed   By: Misty Stanley M.D.   On: 12/30/2021 16:00    ASSESMENT:   Choledocholithiasis.  Cholangitis.  Elevated LFTs. 11/2020 ERCP, sphincterotomy and laparoscopic cholecystectomy. 12/31/2021 ERCP: Previous small sphincterotomy, extensive purulent choledocholithiasis encountered and treated with balloon dilation of sphincterotomy site and balloon sweep.  MD felt there may be small impacted soft tone remaining in the distal bile duct and therefore placed plastic stent into CBD. LFTs continue improving trend but not yet normalized.  WBCs improved not yet normalized. No growth on blood cultures.  Hyponatremia, mild.  Sodium 134.  Noncritical thrombocytopenia, not a new issue.   PLAN   Finish additional 24 hours of IV antibiotics and then 4 to 5 days oral antibiotics (Augmentin).  Repeat ERCP in approximately 8 weeks, office will coordinate care.  I emphasized that he should pick up the antibiotics and take them.  Also advised he and his wife that if he does not return for repeat ERCP that there is a chance that this could happen again and he could get critically or deathly sick.      ?  Okay for discharge today I will discuss with the physician.    Azucena Freed  01/01/2022, 11:37 AM Phone [phone]

## 2022-01-01 NOTE — Plan of Care (Signed)
  Problem: Pain Managment: Goal: General experience of comfort will improve Outcome: Progressing   Problem: Safety: Goal: Ability to remain free from injury will improve Outcome: Progressing   Problem: Skin Integrity: Goal: Risk for impaired skin integrity will decrease Outcome: Progressing   

## 2022-01-01 NOTE — Anesthesia Postprocedure Evaluation (Signed)
Anesthesia Post Note  Patient: Herbert Carr  Procedure(s) Performed: ENDOSCOPIC RETROGRADE CHOLANGIOPANCREATOGRAPHY (ERCP) BILIARY DILATION REMOVAL OF STONES BILIARY STENT PLACEMENT     Patient location during evaluation: PACU Anesthesia Type: General Level of consciousness: awake and alert Pain management: pain level controlled Vital Signs Assessment: post-procedure vital signs reviewed and stable Respiratory status: spontaneous breathing, nonlabored ventilation, respiratory function stable and patient connected to nasal cannula oxygen Cardiovascular status: blood pressure returned to baseline and stable Postop Assessment: no apparent nausea or vomiting Anesthetic complications: no   No notable events documented.  Last Vitals:  Vitals:   01/01/22 0416 01/01/22 0948  BP: 112/64 (!) 140/56  Pulse: (!) 50 (!) 57  Resp: 17 18  Temp: 36.4 C (!) 36.4 C  SpO2: 99% 100%    Last Pain:  Vitals:   01/01/22 0948  TempSrc: Oral  PainSc:                  [name]

## 2022-01-01 NOTE — Progress Notes (Signed)
PROGRESS NOTE    Herbert Carr  HER:[id number] DOB: 09-13-1940 DOA: 12/30/2021 PCP: Louviers Nation, MD    Brief Narrative:  81 year old gentleman with history of prostate cancer, hypothyroidism, coronary artery disease, STEMI, chronic thrombocytopenia and history of Klebsiella bacteremia, choledocholithiasis and cholangitis 1 year ago status post lap chole presented to [facility] with intense abdominal and back pain with history of intermittent tolerable abdominal pain since lap chole.  In the emergency room afebrile.  Was hypotensive and tachycardic.  Found to have grossly abnormal LFTs.  CT angiogram of the chest abdomen pelvis with 3 x 3 cm hyperenhancing lesion on the left liver dome.  Mild intra extrahepatic ductal dilatation.  Found to have noncalcified CBD stones.  Transferred to [facility] with GI consultation.   Assessment & Plan:   Choledocholithiasis, cholangitis with severe abdominal pain: Patient is status post cholecystectomy 1 year ago. ERCP, sphincterotomy and pus evacuation from biliary duct 7/10. Symptoms are well controlled today. Tolerating regular diet.  Blood cultures negative so far.  Intraoperative cultures not done.  Currently on cefepime and Flagyl. As per GI recommendation, continue cefepime and Flagyl today, hopefully he can go home on oral antibiotics tomorrow. Advance activities. Daily monitoring of LFTs, clinically improving but LFTs remain elevated.  Will need repeat ERCP, GI will schedule as outpatient.  Coronary artery disease status post PCI: History of NSTEMI with drug-eluting stent.  Currently no chest pain.  On aspirin and metoprolol.  Essential hypertension: Blood pressure stable on metoprolol.  Can change to MedSurg bed.  Discontinue telemetry monitor.  Mobilize.  Advance diet.   DVT prophylaxis: SCDs Start: 12/30/21 2043   Code Status: Full code Family Communication: None today. Disposition Plan: Status is:  Inpatient Remains inpatient appropriate because: Immediate postop.  IV antibiotics.     Consultants:  Gastroenterology  Procedures:  ERCP,   Antimicrobials:  Cefepime and Flagyl 7/9--   Subjective:  Patient seen and examined.  He is so eager to go home and denies any complaints.  Did not get good sleep in the hospital.  Remains afebrile.  Denies any nausea vomiting.  Tolerating regular diet.  Objective: Vitals:   12/31/21 1529 12/31/21 2217 01/01/22 0416 01/01/22 0948  BP: (!) 117/55 125/68 112/64 (!) 140/56  Pulse: (!) 53 (!) 54 (!) 50 (!) 57  Resp: '16 18 17 18  '$ Temp: 98.5 F (36.9 C) 98.6 F (37 C) 97.6 F (36.4 C) (!) 97.5 F (36.4 C)  TempSrc: Oral  Oral Oral  SpO2: 98% 100% 99% 100%  Weight:      Height:        Intake/Output Summary (Last 24 hours) at 01/01/2022 1158 Last data filed at 01/01/2022 0900 Gross per 24 hour  Intake 1360 ml  Output 300 ml  Net 1060 ml   Filed Weights   12/30/21 1417  Weight: 72.6 kg    Examination:  General exam: Appears calm and comfortable sitting in couch.  Looks comfortable. Respiratory system: Clear to auscultation. Respiratory effort normal. Cardiovascular system: S1 & S2 heard, RRR.  Gastrointestinal system: Soft.  Nontender.  Bowel sound present. Central nervous system: Alert and oriented. No focal neurological deficits.    Data Reviewed: I have personally reviewed following labs and imaging studies  CBC: Recent Labs  Lab 12/30/21 1421 12/31/21 0132  WBC 17.4* 13.0*  HGB 15.2 11.9*  HCT 43.5 32.6*  MCV 92.8 89.8  PLT 156 856*   Basic Metabolic Panel: Recent Labs  Lab 12/30/21  1421 12/31/21 0132 01/01/22 0141  NA 135 135 134*  K 5.0 3.4* 4.0  CL 100 102 103  CO2 '25 23 24  '$ GLUCOSE 149* 89 176*  BUN '14 11 17  '$ CREATININE 0.47* 0.72 0.87  CALCIUM 9.2 8.4* 8.2*  MG 2.1  --   --    GFR: Estimated Creatinine Clearance: 67.2 mL/min (by C-G formula based on SCr of 0.87 mg/dL). Liver Function  Tests: Recent Labs  Lab 12/30/21 1421 12/31/21 0132 01/01/22 0141  AST 609* 251* 91*  ALT 445* 324* 205*  ALKPHOS 195* 136* 117  BILITOT 4.8* 5.3* 5.2*  PROT 8.0 5.3* 5.0*  ALBUMIN 4.6 2.8* 2.6*   Recent Labs  Lab 12/30/21 1421  LIPASE 40   No results for input(s): "AMMONIA" in the last 168 hours. Coagulation Profile: Recent Labs  Lab 12/30/21 1720  INR 1.2   Cardiac Enzymes: No results for input(s): "CKTOTAL", "CKMB", "CKMBINDEX", "TROPONINI" in the last 168 hours. BNP (last 3 results) No results for input(s): "PROBNP" in the last 8760 hours. HbA1C: No results for input(s): "HGBA1C" in the last 72 hours. CBG: No results for input(s): "GLUCAP" in the last 168 hours. Lipid Profile: No results for input(s): "CHOL", "HDL", "LDLCALC", "TRIG", "CHOLHDL", "LDLDIRECT" in the last 72 hours. Thyroid Function Tests: No results for input(s): "TSH", "T4TOTAL", "FREET4", "T3FREE", "THYROIDAB" in the last 72 hours. Anemia Panel: No results for input(s): "VITAMINB12", "FOLATE", "FERRITIN", "TIBC", "IRON", "RETICCTPCT" in the last 72 hours. Sepsis Labs: Recent Labs  Lab 12/30/21 1655 12/30/21 1947  LATICACIDVEN 3.0* 1.6    Recent Results (from the past 240 hour(s))  Blood Culture (routine x 2)     Status: None (Preliminary result)   Collection Time: 12/30/21  4:55 PM   Specimen: BLOOD RIGHT FOREARM  Result Value Ref Range Status   Specimen Description   Final    BLOOD RIGHT FOREARM BOTTLES DRAWN AEROBIC AND ANAEROBIC   Special Requests Blood Culture adequate volume  Final   Culture   Final    NO GROWTH 2 DAYS Performed at [facility], [address] [place], [place] [address]    Report Status PENDING  Incomplete  Blood Culture (routine x 2)     Status: None (Preliminary result)   Collection Time: 12/30/21  5:20 PM   Specimen: BLOOD RIGHT HAND  Result Value Ref Range Status   Specimen Description   Final    BLOOD RIGHT HAND BOTTLES DRAWN AEROBIC AND ANAEROBIC    Special Requests Blood Culture adequate volume  Final   Culture   Final    NO GROWTH 2 DAYS Performed at [facility], [address], [place], [place] [address]    Report Status PENDING  Incomplete         Radiology Studies: DG ERCP  Result Date: 12/31/2021 CLINICAL DATA:  ERCP EXAM: ERCP TECHNIQUE: Multiple spot images obtained with the fluoroscopic device and submitted for interpretation post-procedure. FLUOROSCOPY TIME: FLUOROSCOPY TIME 5 minutes, 56 seconds (31.7 mGy) COMPARISON:  CT abdomen and pelvis-12/30/2021 ERCP-12/13/2020 FINDINGS: Five spot intraoperative fluoroscopic images the right upper abdominal quadrant during ERCP are provided for review Initial image demonstrates an ERCP probe overlying the right upper abdominal quadrant. Surgical clips overlie the gallbladder fossa. There is selective cannulation and opacification of the CBD which appears moderately dilated. Several nonocclusive filling defects are seen within the mid and distal aspects of the CBD (image 1) Subsequent images demonstrate insufflation of a balloon within the distal aspect of the CBD. Subsequent images  demonstrate insufflation of a balloon within distal aspect of the CBD with subsequent presumed biliary weeping and stone/sludge extraction. Completion image demonstrates placement of a stent overlying the expected location of the distal aspect of the CBD. There is minimal opacification of the intrahepatic biliary tree which appears mildly dilated. There is no definitive opacification of the cystic or pancreatic ducts. IMPRESSION: ERCP with suspected choledocholithiasis with subsequent biliary sweeping and biliary stent placement. These images were submitted for radiologic interpretation only. Please see the procedural report for the amount of contrast and the fluoroscopy time utilized. Electronically Signed   By: Sandi Mariscal M.D.   On: 12/31/2021 16:14   CT Angio Chest/Abd/Pel for Dissection W and/or Wo  Contrast  Result Date: 12/30/2021 CLINICAL DATA:  Acute aortic syndrome abdominal pain. EXAM: CT ANGIOGRAPHY CHEST, ABDOMEN AND PELVIS TECHNIQUE: Non-contrast CT of the chest was initially obtained. Multidetector CT imaging through the chest, abdomen and pelvis was performed using the standard protocol during bolus administration of intravenous contrast. Multiplanar reconstructed images and MIPs were obtained and reviewed to evaluate the vascular anatomy. RADIATION DOSE REDUCTION: This exam was performed according to the departmental dose-optimization program which includes automated exposure control, adjustment of the mA and/or kV according to patient size and/or use of iterative reconstruction technique. CONTRAST:  167m OMNIPAQUE IOHEXOL 350 MG/ML SOLN COMPARISON:  Chest CTA 03/25/2017.  CT stone study 12/12/2020. FINDINGS: CTA CHEST FINDINGS Cardiovascular: Pre contrast imaging shows no hyperdense crescent in the wall of the thoracic aorta to suggest the presence of an acute intramural hematoma. No thoracic aortic aneurysm no dissection of the thoracic aorta. No large central pulmonary embolus in the main or lobar pulmonary arteries Mediastinum/Nodes: No mediastinal lymphadenopathy. There is no hilar lymphadenopathy. The esophagus has normal imaging features. There is no axillary lymphadenopathy. Lungs/Pleura: No suspicious pulmonary nodule or mass. No focal airspace consolidation. No pleural effusion. No evidence for pneumothorax. Musculoskeletal: No worrisome lytic or sclerotic osseous abnormality. Review of the MIP images confirms the above findings. CTA ABDOMEN AND PELVIS FINDINGS VASCULAR Aorta: Normal caliber aorta without aneurysm, dissection, vasculitis or significant stenosis. There is moderate atherosclerotic calcification of the abdominal aorta. Celiac: Patent without evidence of aneurysm, dissection, vasculitis or significant stenosis. SMA: Patent without evidence of aneurysm, dissection, vasculitis  or significant stenosis. Replaced common hepatic artery. Renals: Both renal arteries are patent without evidence of aneurysm, dissection, vasculitis, fibromuscular dysplasia or significant stenosis. Accessory right renal artery noted. IMA: Patent without evidence of aneurysm, dissection, vasculitis or significant stenosis. Inflow: Patent without evidence of aneurysm, dissection, vasculitis or significant stenosis. Veins: No obvious venous abnormality within the limitations of this arterial phase study. Review of the MIP images confirms the above findings. NON-VASCULAR Hepatobiliary: 3.2 x 2.9 cm subtle hyperenhancing lesion identified in the dome of the left liver (image 87/6 and coronal 60/10). Mild intrahepatic biliary duct dilatation is progressive since chest CTA 03/25/2017. Previous abdomen CT was performed without intravenous contrast, limiting comparison. Gallbladder surgically absent. Common bile duct measures up to 12 mm diameter. 2 adjacent uncalcified filling defects are seen in the distal common bile duct on coronal imaging (see coronal image 70/series 10. There is an additional filling defect right at the ampulla the contains gas, suggesting cholesterol stone (see axial image 129/6 and coronal 70/10). Pancreas: No focal mass lesion. No dilatation of the main duct. No intraparenchymal cyst. No peripancreatic edema. Spleen: No splenomegaly. No focal mass lesion. Adrenals/Urinary Tract: 14 mm right adrenal nodule cannot be definitively characterized but is stable since  chest CTA 03/25/2017. Thickening of the left adrenal gland evident. Kidneys unremarkable. No evidence for hydroureter. The urinary bladder appears normal for the degree of distention. Stomach/Bowel: Stomach is unremarkable. No gastric wall thickening. No evidence of outlet obstruction. Duodenum is normally positioned as is the ligament of Treitz. No small bowel wall thickening. No small bowel dilatation. The terminal ileum is normal. The  appendix is normal. No gross colonic mass. No colonic wall thickening. Lymphatic: There is no gastrohepatic or hepatoduodenal ligament lymphadenopathy. No retroperitoneal or mesenteric lymphadenopathy. No pelvic sidewall lymphadenopathy. Reproductive: Brachytherapy seeds noted in the prostate gland. Other: No intraperitoneal free fluid. Musculoskeletal: No worrisome lytic or sclerotic osseous abnormality. Review of the MIP images confirms the above findings. IMPRESSION: 1. No evidence for acute intramural hematoma or dissection in the thoracoabdominal aorta. No thoracoabdominal aortic aneurysm. 2. 3.2 x 2.9 cm subtle hyperenhancing lesion in the dome of the left liver. MRI/MRCP with and without contrast recommended to further evaluate. 3. Mild to moderate intra and extrahepatic biliary duct dilatation with 3 noncalcified distal common bile duct stones evident by CT. Correlation with liver function test suggested. This could also be further evaluated at MRI/MRCP. 4. 14 mm right adrenal nodule, stable since 2018, most likely benign. 5. Aortic Atherosclerosis (ICD10-I70.0). Electronically Signed   By: Misty Stanley M.D.   On: 12/30/2021 16:00        Scheduled Meds:  aspirin EC  81 mg Oral Daily   levothyroxine  100 mcg Oral QAC breakfast   metoprolol succinate  25 mg Oral Daily   pantoprazole (PROTONIX) IV  40 mg Intravenous Q24H   Continuous Infusions:  sodium chloride 10 mL/hr at 12/31/21 1738   ceFEPime (MAXIPIME) IV Stopped (12/30/21 1748)   ceFEPime (MAXIPIME) IV 2 g (01/01/22 0951)   metronidazole 500 mg (01/01/22 0415)     LOS: 2 days    Time spent: 35 minutes    Barb Merino, MD [facility] Pager [phone]

## 2022-01-01 NOTE — Progress Notes (Signed)
AVS given and reviewed with pt and wife. Medications discussed. All questions answered to satisfaction. Pt verbalized understanding of information given. Pt escorted off the unit with all belongings via wheelchair by staff member.

## 2022-01-01 NOTE — Progress Notes (Signed)
Mobility Specialist Progress Note:   01/01/22 1120  Mobility  Activity Ambulated independently in hallway  Level of Assistance Independent  Assistive Device None  Distance Ambulated (ft) 1100 ft  Activity Response Tolerated well  $Mobility charge 1 Mobility   Pt eager for mobility session this am. No physical assistance required throughout. Pt back in chair with all needs met.   Nelta Numbers Acute Rehab Secure Chat or Office Phone: [phone]

## 2022-01-04 LAB — CULTURE, BLOOD (ROUTINE X 2)
Culture: NO GROWTH
Culture: NO GROWTH
Special Requests: ADEQUATE
Special Requests: ADEQUATE

## 2022-01-09 DIAGNOSIS — Z8719 Personal history of other diseases of the digestive system: Secondary | ICD-10-CM | POA: Diagnosis not present

## 2022-01-09 DIAGNOSIS — K8050 Calculus of bile duct without cholangitis or cholecystitis without obstruction: Secondary | ICD-10-CM | POA: Diagnosis not present

## 2022-01-16 ENCOUNTER — Telehealth: Payer: Self-pay | Admitting: Gastroenterology

## 2022-01-16 NOTE — Telephone Encounter (Signed)
Pt had ERCP on 7/10 and has since developed soreness under the ribs on both sides.  Started a week after ERCP. Pain/soreness is resolved when lying down.  Hurts when walking (more soreness) no fever, saw PCP and labs done and was told they are normal.  No abd pain.  He wonders if it is "arthritis"  normal BM's.  No SOB.  Is still able to jog around his pool.  He believes he may have had this same problem years ago.  He wants to ensure this is nothing to be concerned about. Please advise

## 2022-01-16 NOTE — Telephone Encounter (Signed)
Inbound call from patient stating that he had a procedure with Dr. Genoveva Ill at [facility] on 7/10 and patient stated that he is hurting underneath his ribs and he is very sore. Patient is seeking advise. Please advise.

## 2022-01-16 NOTE — Telephone Encounter (Signed)
The labs are being faxed this morning to your attention

## 2022-01-17 NOTE — Telephone Encounter (Signed)
The pt states he is feeling better since cutting down on his jogging. He will follow up with PCP if symptoms return.

## 2022-01-17 NOTE — Telephone Encounter (Signed)
The patient has been notified of this information and all questions answered.

## 2022-01-23 DIAGNOSIS — L218 Other seborrheic dermatitis: Secondary | ICD-10-CM | POA: Diagnosis not present

## 2022-01-23 DIAGNOSIS — L570 Actinic keratosis: Secondary | ICD-10-CM | POA: Diagnosis not present

## 2022-01-23 DIAGNOSIS — X32XXXD Exposure to sunlight, subsequent encounter: Secondary | ICD-10-CM | POA: Diagnosis not present

## 2022-01-31 DIAGNOSIS — Z8719 Personal history of other diseases of the digestive system: Secondary | ICD-10-CM | POA: Diagnosis not present

## 2022-01-31 DIAGNOSIS — K8050 Calculus of bile duct without cholangitis or cholecystitis without obstruction: Secondary | ICD-10-CM | POA: Diagnosis not present

## 2022-01-31 DIAGNOSIS — Z6820 Body mass index (BMI) 20.0-20.9, adult: Secondary | ICD-10-CM | POA: Diagnosis not present

## 2022-02-26 ENCOUNTER — Telehealth: Payer: Self-pay | Admitting: Gastroenterology

## 2022-02-26 NOTE — Telephone Encounter (Signed)
Thanks a lot [employer]

## 2022-02-26 NOTE — Telephone Encounter (Signed)
DCM, I am happy to go ahead and get that patient on my list.  Will be October based on availability but as he has a stent in place that should be okay. I am going to recommend we obtain a hepatic function panel as well as amylase/lipase as well as a KUB 2-view to ensure that the CBD stent is still in place.  If the LFTs have worsened or the biliary stent is not in place, then we can see what I can do about getting an earlier ERCP date.  Patty, Please work on getting the labs done and the KUB performed.  If he is closer to have this done at [facility] then would go ahead and just get it done at [facility].  Then go ahead and get him on my list for next available ERCP.  Thanks. GM

## 2022-02-26 NOTE — Telephone Encounter (Signed)
Dr Jenetta Downer we received a call from this pt about repeat ERCP.  Dr Ardis Hughs did the last one since he was on-call.  He is a patient of yours however.  Can you speak with the pt. I tried to call him back and  his voice mail is full.

## 2022-02-26 NOTE — Telephone Encounter (Signed)
Patient called, states he would like to talk to a nurse and needs answers to few questions. Please call to advise.

## 2022-02-26 NOTE — Telephone Encounter (Signed)
Hi Herbert Carr, Thanks for your message.  I spoke to the patient and he endorsed having some discomfort in the sides of his abdomen, which she has had for several months. He was inquiring about when his endoscopic retrograde cholangiopancreatography will be rescheduled.  I informed him that the procedure should be performed in September as recommended by Dr. Ardis Hughs in the past but another gastroenterologist will be performing it. Can you please help me coordinate his repeat endoscopic retrograde cholangiopancreatography with the GIs at [facility]?  Thanks

## 2022-02-27 ENCOUNTER — Other Ambulatory Visit: Payer: Self-pay

## 2022-02-27 DIAGNOSIS — R748 Abnormal levels of other serum enzymes: Secondary | ICD-10-CM

## 2022-02-27 DIAGNOSIS — K8309 Other cholangitis: Secondary | ICD-10-CM

## 2022-02-27 DIAGNOSIS — R140 Abdominal distension (gaseous): Secondary | ICD-10-CM

## 2022-02-27 DIAGNOSIS — K831 Obstruction of bile duct: Secondary | ICD-10-CM

## 2022-02-27 NOTE — Telephone Encounter (Signed)
ERCP has been scheduled for 10/23 at 11 am at [facility] with GM   Left message on machine to call back

## 2022-02-27 NOTE — Telephone Encounter (Signed)
Labs have been entered 2 view xray has been ordered  ERCP to be scheduled.

## 2022-02-28 ENCOUNTER — Ambulatory Visit (INDEPENDENT_AMBULATORY_CARE_PROVIDER_SITE_OTHER)
Admission: RE | Admit: 2022-02-28 | Discharge: 2022-02-28 | Disposition: A | Discharge status: Home / Self Care | Payer: Medicare HMO | Source: Ambulatory Visit | Attending: Gastroenterology | Admitting: Gastroenterology

## 2022-02-28 ENCOUNTER — Telehealth: Payer: Self-pay | Admitting: Gastroenterology

## 2022-02-28 ENCOUNTER — Other Ambulatory Visit (INDEPENDENT_AMBULATORY_CARE_PROVIDER_SITE_OTHER): Payer: Medicare HMO

## 2022-02-28 DIAGNOSIS — R748 Abnormal levels of other serum enzymes: Secondary | ICD-10-CM

## 2022-02-28 DIAGNOSIS — Z960 Presence of urogenital implants: Secondary | ICD-10-CM | POA: Diagnosis not present

## 2022-02-28 DIAGNOSIS — M5136 Other intervertebral disc degeneration, lumbar region: Secondary | ICD-10-CM | POA: Diagnosis not present

## 2022-02-28 DIAGNOSIS — K831 Obstruction of bile duct: Secondary | ICD-10-CM

## 2022-02-28 DIAGNOSIS — Z9049 Acquired absence of other specified parts of digestive tract: Secondary | ICD-10-CM | POA: Diagnosis not present

## 2022-02-28 LAB — HEPATIC FUNCTION PANEL
ALT: 17 U/L (ref 0–53)
AST: 16 U/L (ref 0–37)
Albumin: 4 g/dL (ref 3.5–5.2)
Alkaline Phosphatase: 64 U/L (ref 39–117)
Bilirubin, Direct: 0.2 mg/dL (ref 0.0–0.3)
Total Bilirubin: 0.7 mg/dL (ref 0.2–1.2)
Total Protein: 6.9 g/dL (ref 6.0–8.3)

## 2022-02-28 LAB — AMYLASE: Amylase: 57 U/L (ref 27–131)

## 2022-02-28 LAB — LIPASE: Lipase: 61 U/L — ABNORMAL HIGH (ref 11.0–59.0)

## 2022-02-28 NOTE — Telephone Encounter (Signed)
Pt called and states he is having lower back and side pain. States he had a stint put in and says it was supposed to be removed in 2 months but scheduled for October which would be 3. Patient would like a call back to discuss. Thank you

## 2022-02-28 NOTE — Telephone Encounter (Signed)
The pt has been advised of the labs and xray order.   He will come in for those today.  ERCP scheduled, pt instructed and medications reviewed.  Patient instructions mailed to home and sent to My Chart.  Patient to call with any questions or concerns.

## 2022-02-28 NOTE — Telephone Encounter (Signed)
See alternate phone note  

## 2022-03-01 DIAGNOSIS — E039 Hypothyroidism, unspecified: Secondary | ICD-10-CM | POA: Diagnosis not present

## 2022-03-01 DIAGNOSIS — Z131 Encounter for screening for diabetes mellitus: Secondary | ICD-10-CM | POA: Diagnosis not present

## 2022-03-01 DIAGNOSIS — L409 Psoriasis, unspecified: Secondary | ICD-10-CM | POA: Diagnosis not present

## 2022-03-01 DIAGNOSIS — E569 Vitamin deficiency, unspecified: Secondary | ICD-10-CM | POA: Diagnosis not present

## 2022-03-01 DIAGNOSIS — I10 Essential (primary) hypertension: Secondary | ICD-10-CM | POA: Diagnosis not present

## 2022-03-01 DIAGNOSIS — Z13220 Encounter for screening for lipoid disorders: Secondary | ICD-10-CM | POA: Diagnosis not present

## 2022-03-01 DIAGNOSIS — I2510 Atherosclerotic heart disease of native coronary artery without angina pectoris: Secondary | ICD-10-CM | POA: Diagnosis not present

## 2022-03-01 DIAGNOSIS — Z125 Encounter for screening for malignant neoplasm of prostate: Secondary | ICD-10-CM | POA: Diagnosis not present

## 2022-03-02 ENCOUNTER — Telehealth: Payer: Self-pay | Admitting: Physician Assistant

## 2022-03-02 NOTE — Telephone Encounter (Signed)
81 year old male with recent ERCP with stent, scheduled in October for stent removal with Dr. Rush Landmark. Patient called earlier with abdominal discomfort, had lipase minimally elevated 61, normal lites and LFTs.  X-ray showed atelectasis, will place stent, and constipation and lower back arthritis.  Patient called with concerns of atelectasis, went to detail with the patient of what this was. Patient described pain he was having under bilateral ribs also pain in lower back, worse with movement better when he is still.  Patient does jog around the pool and takes very deep breaths. Likely costochondritis. Denies fever, chills, cough, leg swelling, nausea and vomiting.  Oxygen good at 90% per patient. We will do trial of Salonpas patches. Discussed ER precautions or reasons to give [place] call back.

## 2022-03-02 NOTE — Telephone Encounter (Signed)
AC, Thank you for the update. Agree with plan of action. We will forward this to the patient's primary gastroenterologist so that his team can follow-up with the patient next week or for any persisting issues to the patient's PCP. GM

## 2022-03-03 NOTE — Telephone Encounter (Signed)
Thanks [employer]. Crystal, can you please check with the patient about his current complaints? He may need an earlier appointment if this GI related, otherwise can follow with his PCP.

## 2022-03-04 NOTE — Telephone Encounter (Signed)
Patient made aware he can take Tylenol 650 gm every 8 hours as needed and can use lidocaine patches PRN

## 2022-03-04 NOTE — Telephone Encounter (Signed)
Tried calling no answer, unable to leave a vm as voice mail is full.

## 2022-03-04 NOTE — Telephone Encounter (Signed)
I spoke with the patient today he says he has been having issues with bilateral mid abdominal pains under each rib and some mid back pain. He says his x ray had showed some arthritis and he thinks related to that. He says he is scheduled for stent removal around 04/16/2022. Patient denies any nausea,vomiting, constipation, diarrhea, fevers, dark or bloody stools. No pending appointment here at the office. Please advise.

## 2022-03-04 NOTE — Telephone Encounter (Signed)
Please ask him to follow with his PCP, he can take Tylenol 650 gm every 8 hours as needed and can use lidocaine patches PRN

## 2022-03-07 ENCOUNTER — Other Ambulatory Visit: Payer: Self-pay | Admitting: Internal Medicine

## 2022-03-07 ENCOUNTER — Other Ambulatory Visit (HOSPITAL_COMMUNITY): Payer: Self-pay | Admitting: Internal Medicine

## 2022-03-07 DIAGNOSIS — Z9049 Acquired absence of other specified parts of digestive tract: Secondary | ICD-10-CM | POA: Diagnosis not present

## 2022-03-07 DIAGNOSIS — L989 Disorder of the skin and subcutaneous tissue, unspecified: Secondary | ICD-10-CM | POA: Diagnosis not present

## 2022-03-07 DIAGNOSIS — Z0001 Encounter for general adult medical examination with abnormal findings: Secondary | ICD-10-CM | POA: Diagnosis not present

## 2022-03-07 DIAGNOSIS — I2510 Atherosclerotic heart disease of native coronary artery without angina pectoris: Secondary | ICD-10-CM | POA: Diagnosis not present

## 2022-03-07 DIAGNOSIS — I10 Essential (primary) hypertension: Secondary | ICD-10-CM | POA: Diagnosis not present

## 2022-03-07 DIAGNOSIS — K769 Liver disease, unspecified: Secondary | ICD-10-CM

## 2022-03-07 DIAGNOSIS — E039 Hypothyroidism, unspecified: Secondary | ICD-10-CM | POA: Diagnosis not present

## 2022-03-07 DIAGNOSIS — L409 Psoriasis, unspecified: Secondary | ICD-10-CM | POA: Diagnosis not present

## 2022-03-07 DIAGNOSIS — G4700 Insomnia, unspecified: Secondary | ICD-10-CM | POA: Diagnosis not present

## 2022-03-08 ENCOUNTER — Encounter (INDEPENDENT_AMBULATORY_CARE_PROVIDER_SITE_OTHER): Payer: Self-pay | Admitting: Gastroenterology

## 2022-03-13 ENCOUNTER — Other Ambulatory Visit (INDEPENDENT_AMBULATORY_CARE_PROVIDER_SITE_OTHER): Payer: Self-pay | Admitting: Gastroenterology

## 2022-03-13 DIAGNOSIS — K769 Liver disease, unspecified: Secondary | ICD-10-CM

## 2022-03-30 ENCOUNTER — Ambulatory Visit (HOSPITAL_BASED_OUTPATIENT_CLINIC_OR_DEPARTMENT_OTHER)
Admission: RE | Admit: 2022-03-30 | Discharge: 2022-03-30 | Disposition: A | Discharge status: Home / Self Care | Payer: Medicare HMO | Source: Ambulatory Visit | Attending: Gastroenterology | Admitting: Gastroenterology

## 2022-03-30 DIAGNOSIS — K769 Liver disease, unspecified: Secondary | ICD-10-CM | POA: Insufficient documentation

## 2022-03-30 MED ORDER — IOHEXOL 300 MG/ML  SOLN
100.0000 mL | Freq: Once | INTRAMUSCULAR | Status: AC | PRN
  Administered 2022-03-30: 100 mL via INTRAVENOUS

## 2022-04-04 ENCOUNTER — Other Ambulatory Visit (INDEPENDENT_AMBULATORY_CARE_PROVIDER_SITE_OTHER): Payer: Self-pay | Admitting: Gastroenterology

## 2022-04-05 DIAGNOSIS — R748 Abnormal levels of other serum enzymes: Secondary | ICD-10-CM | POA: Diagnosis not present

## 2022-04-05 DIAGNOSIS — Z1289 Encounter for screening for malignant neoplasm of other sites: Secondary | ICD-10-CM | POA: Diagnosis not present

## 2022-04-05 DIAGNOSIS — K769 Liver disease, unspecified: Secondary | ICD-10-CM | POA: Diagnosis not present

## 2022-04-06 LAB — AFP TUMOR MARKER: AFP, Serum, Tumor Marker: <1.8 ng/mL (ref 0.0–6.4)

## 2022-04-08 ENCOUNTER — Encounter (HOSPITAL_COMMUNITY): Payer: Self-pay | Admitting: Gastroenterology

## 2022-04-15 ENCOUNTER — Ambulatory Visit (HOSPITAL_COMMUNITY): Payer: Medicare HMO | Admitting: Anesthesiology

## 2022-04-15 ENCOUNTER — Encounter (HOSPITAL_COMMUNITY)
Admission: RE | Disposition: A | Discharge status: Home / Self Care | Payer: Self-pay | Source: Ambulatory Visit | Attending: Gastroenterology

## 2022-04-15 ENCOUNTER — Encounter (HOSPITAL_COMMUNITY): Payer: Self-pay | Admitting: Gastroenterology

## 2022-04-15 ENCOUNTER — Ambulatory Visit (HOSPITAL_BASED_OUTPATIENT_CLINIC_OR_DEPARTMENT_OTHER): Payer: Medicare HMO | Admitting: Anesthesiology

## 2022-04-15 ENCOUNTER — Other Ambulatory Visit: Payer: Self-pay

## 2022-04-15 ENCOUNTER — Ambulatory Visit (HOSPITAL_COMMUNITY): Payer: Medicare HMO

## 2022-04-15 ENCOUNTER — Ambulatory Visit (HOSPITAL_COMMUNITY)
Admission: RE | Admit: 2022-04-15 | Discharge: 2022-04-15 | Disposition: A | Discharge status: Home / Self Care | Payer: Medicare HMO | Source: Ambulatory Visit | Attending: Gastroenterology | Admitting: Gastroenterology

## 2022-04-15 ENCOUNTER — Telehealth: Payer: Self-pay | Admitting: Gastroenterology

## 2022-04-15 DIAGNOSIS — I10 Essential (primary) hypertension: Secondary | ICD-10-CM | POA: Insufficient documentation

## 2022-04-15 DIAGNOSIS — Z4659 Encounter for fitting and adjustment of other gastrointestinal appliance and device: Secondary | ICD-10-CM

## 2022-04-15 DIAGNOSIS — Z79899 Other long term (current) drug therapy: Secondary | ICD-10-CM | POA: Insufficient documentation

## 2022-04-15 DIAGNOSIS — I2510 Atherosclerotic heart disease of native coronary artery without angina pectoris: Secondary | ICD-10-CM | POA: Diagnosis not present

## 2022-04-15 DIAGNOSIS — K838 Other specified diseases of biliary tract: Secondary | ICD-10-CM

## 2022-04-15 DIAGNOSIS — Z8546 Personal history of malignant neoplasm of prostate: Secondary | ICD-10-CM | POA: Diagnosis not present

## 2022-04-15 DIAGNOSIS — I252 Old myocardial infarction: Secondary | ICD-10-CM | POA: Diagnosis not present

## 2022-04-15 DIAGNOSIS — R748 Abnormal levels of other serum enzymes: Secondary | ICD-10-CM

## 2022-04-15 DIAGNOSIS — Z9049 Acquired absence of other specified parts of digestive tract: Secondary | ICD-10-CM | POA: Diagnosis not present

## 2022-04-15 DIAGNOSIS — K831 Obstruction of bile duct: Secondary | ICD-10-CM

## 2022-04-15 DIAGNOSIS — E039 Hypothyroidism, unspecified: Secondary | ICD-10-CM | POA: Diagnosis not present

## 2022-04-15 DIAGNOSIS — K8050 Calculus of bile duct without cholangitis or cholecystitis without obstruction: Secondary | ICD-10-CM

## 2022-04-15 DIAGNOSIS — R140 Abdominal distension (gaseous): Secondary | ICD-10-CM

## 2022-04-15 DIAGNOSIS — Z955 Presence of coronary angioplasty implant and graft: Secondary | ICD-10-CM | POA: Diagnosis not present

## 2022-04-15 DIAGNOSIS — K219 Gastro-esophageal reflux disease without esophagitis: Secondary | ICD-10-CM | POA: Insufficient documentation

## 2022-04-15 DIAGNOSIS — K8309 Other cholangitis: Secondary | ICD-10-CM

## 2022-04-15 DIAGNOSIS — M795 Residual foreign body in soft tissue: Secondary | ICD-10-CM

## 2022-04-15 HISTORY — PX: BILIARY DILATION: SHX6850

## 2022-04-15 HISTORY — PX: STENT REMOVAL: SHX6421

## 2022-04-15 HISTORY — PX: ENDOSCOPIC RETROGRADE CHOLANGIOPANCREATOGRAPHY (ERCP) WITH PROPOFOL: SHX5810

## 2022-04-15 HISTORY — PX: SPHINCTEROTOMY: SHX5544

## 2022-04-15 HISTORY — PX: REMOVAL OF STONES: SHX5545

## 2022-04-15 SURGERY — ENDOSCOPIC RETROGRADE CHOLANGIOPANCREATOGRAPHY (ERCP) WITH PROPOFOL
Anesthesia: General

## 2022-04-15 MED ORDER — LACTATED RINGERS IV SOLN: INTRAVENOUS | Status: DC

## 2022-04-15 MED ORDER — DICLOFENAC SUPPOSITORY 100 MG
RECTAL | Status: DC | PRN
  Administered 2022-04-15: 100 mg via RECTAL

## 2022-04-15 MED ORDER — CIPROFLOXACIN IN D5W 400 MG/200ML IV SOLN
INTRAVENOUS | Status: DC | PRN
  Administered 2022-04-15: 400 mg via INTRAVENOUS

## 2022-04-15 MED ORDER — GLUCAGON HCL RDNA (DIAGNOSTIC) 1 MG IJ SOLR
INTRAMUSCULAR | Status: DC | PRN
  Administered 2022-04-15 (×3): .25 mg via INTRAVENOUS

## 2022-04-15 MED ORDER — FENTANYL CITRATE (PF) 100 MCG/2ML IJ SOLN
INTRAMUSCULAR | Status: AC
  Filled 2022-04-15: qty 2

## 2022-04-15 MED ORDER — GLUCAGON HCL RDNA (DIAGNOSTIC) 1 MG IJ SOLR
INTRAMUSCULAR | Status: AC
  Filled 2022-04-15: qty 1

## 2022-04-15 MED ORDER — SODIUM CHLORIDE 0.9 % IV SOLN: INTRAVENOUS | Status: DC

## 2022-04-15 MED ORDER — CIPROFLOXACIN IN D5W 400 MG/200ML IV SOLN
INTRAVENOUS | Status: AC
  Filled 2022-04-15: qty 200

## 2022-04-15 MED ORDER — ONDANSETRON HCL 4 MG/2ML IJ SOLN
INTRAMUSCULAR | Status: DC | PRN
  Administered 2022-04-15: 4 mg via INTRAVENOUS

## 2022-04-15 MED ORDER — DICLOFENAC SUPPOSITORY 100 MG
RECTAL | Status: AC
  Filled 2022-04-15: qty 1

## 2022-04-15 MED ORDER — ROCURONIUM BROMIDE 10 MG/ML (PF) SYRINGE
PREFILLED_SYRINGE | INTRAVENOUS | Status: DC | PRN
  Administered 2022-04-15: 60 mg via INTRAVENOUS

## 2022-04-15 MED ORDER — LIDOCAINE 2% (20 MG/ML) 5 ML SYRINGE
INTRAMUSCULAR | Status: DC | PRN
  Administered 2022-04-15: 80 mg via INTRAVENOUS

## 2022-04-15 MED ORDER — SODIUM CHLORIDE 0.9 % IV SOLN
INTRAVENOUS | Status: DC | PRN
  Administered 2022-04-15: 75 mL

## 2022-04-15 MED ORDER — PROPOFOL 10 MG/ML IV BOLUS
INTRAVENOUS | Status: DC | PRN
  Administered 2022-04-15: 100 mg via INTRAVENOUS

## 2022-04-15 MED ORDER — SUGAMMADEX SODIUM 200 MG/2ML IV SOLN
INTRAVENOUS | Status: DC | PRN
  Administered 2022-04-15: 200 mg via INTRAVENOUS

## 2022-04-15 MED ORDER — FENTANYL CITRATE (PF) 100 MCG/2ML IJ SOLN
INTRAMUSCULAR | Status: DC | PRN
  Administered 2022-04-15: 50 ug via INTRAVENOUS

## 2022-04-15 NOTE — Telephone Encounter (Signed)
I advised that the pt can take his medications as prescribed.  We discussed what to look out for in regards to pancreatitis. He will go to the ED if he has significant pain that Tylenol does not take care of.  He will also call his primary care for any worse anxiety.

## 2022-04-15 NOTE — Discharge Instructions (Signed)
YOU HAD AN ENDOSCOPIC PROCEDURE TODAY: Refer to the procedure report and other information in the discharge instructions given to you for any specific questions about what was found during the examination. If this information does not answer your questions, please call [place] office at [phone] to clarify.  ° °YOU SHOULD EXPECT: Some feelings of bloating in the abdomen. Passage of more gas than usual. Walking can help get rid of the air that was put into your GI tract during the procedure and reduce the bloating. If you had a lower endoscopy (such as a colonoscopy or flexible sigmoidoscopy) you may notice spotting of blood in your stool or on the toilet paper. Some abdominal soreness may be present for a day or two, also. ° °DIET: Your first meal following the procedure should be a light meal and then it is ok to progress to your normal diet. A half-sandwich or bowl of soup is an example of a good first meal. Heavy or fried foods are harder to digest and may make you feel nauseous or bloated. Drink plenty of fluids but you should avoid alcoholic beverages for 24 hours. If you had a esophageal dilation, please see attached instructions for diet.   ° °ACTIVITY: Your care partner should take you home directly after the procedure. You should plan to take it easy, moving slowly for the rest of the day. You can resume normal activity the day after the procedure however YOU SHOULD NOT DRIVE, use power tools, machinery or perform tasks that involve climbing or major physical exertion for 24 hours (because of the sedation medicines used during the test).  ° °SYMPTOMS TO REPORT IMMEDIATELY: °A gastroenterologist can be reached at any hour. Please call [phone]  for any of the following symptoms:  °Following lower endoscopy (colonoscopy, flexible sigmoidoscopy) °Excessive amounts of blood in the stool  °Significant tenderness, worsening of abdominal pains  °Swelling of the abdomen that is new, acute  °Fever of 100° or  higher  °Following upper endoscopy (EGD, EUS, ERCP, esophageal dilation) °Vomiting of blood or coffee ground material  °New, significant abdominal pain  °New, significant chest pain or pain under the shoulder blades  °Painful or persistently difficult swallowing  °New shortness of breath  °Black, tarry-looking or red, bloody stools ° °FOLLOW UP:  °If any biopsies were taken you will be contacted by phone or by letter within the next 1-3 weeks. Call [phone]  if you have not heard about the biopsies in 3 weeks.  °Please also call with any specific questions about appointments or follow up tests. ° °

## 2022-04-15 NOTE — Telephone Encounter (Signed)
Pt had procedure today and is feeling very anxious, would like to know if he cant take his klonopin. Please call patient wife [phone] and advise.

## 2022-04-15 NOTE — Anesthesia Procedure Notes (Signed)
Procedure Name: Intubation Date/Time: 04/15/2022 12:15 PM  Performed by: [place] Manus Rudd, CRNAPre-anesthesia Checklist: Patient identified, Emergency Drugs available, Suction available and Patient being monitored Patient Re-evaluated:Patient Re-evaluated prior to induction Oxygen Delivery Method: Circle system utilized Preoxygenation: Pre-oxygenation with 100% oxygen Induction Type: IV induction Ventilation: Mask ventilation without difficulty Laryngoscope Size: Mac and 4 Grade View: Grade I Tube type: Oral Tube size: 7.5 mm Number of attempts: 1 Airway Equipment and Method: Stylet and Oral airway Placement Confirmation: ETT inserted through vocal cords under direct vision, positive ETCO2 and breath sounds checked- equal and bilateral Secured at: 21 cm Tube secured with: Tape Dental Injury: Teeth and Oropharynx as per pre-operative assessment

## 2022-04-15 NOTE — Transfer of Care (Signed)
Immediate Anesthesia Transfer of Care Note  Patient: Herbert Carr  Procedure(s) Performed: ENDOSCOPIC RETROGRADE CHOLANGIOPANCREATOGRAPHY (ERCP) WITH PROPOFOL STENT REMOVAL SPHINCTEROTOMY REMOVAL OF STONES BILIARY DILATION  Patient Location: PACU  Anesthesia Type:General  Level of Consciousness: awake, alert  and oriented  Airway & Oxygen Therapy: Patient Spontanous Breathing and Patient connected to face mask oxygen  Post-op Assessment: Report given to RN and Post -op Vital signs reviewed and stable  Post vital signs: Reviewed and stable  Last Vitals:  Vitals Value Taken Time  BP 163/62 04/15/22 1330  Temp    Pulse 56 04/15/22 1337  Resp 17 04/15/22 1337  SpO2 93 % 04/15/22 1337  Vitals shown include unvalidated device data.  Last Pain:  Vitals:   04/15/22 0941  TempSrc: Oral  PainSc: 0-No pain         Complications: No notable events documented.
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
## 2022-04-15 NOTE — Op Note (Addendum)
[facility] Patient Name: Herbert Carr Procedure Date: 04/15/2022 MRN: [id number] Attending MD: Justice Britain , MD Date of Birth: Jul 17, 1940 CSN: [id number] Age: 81 Admit Type: Outpatient Procedure:                ERCP Indications:              Bile duct stone(s), Biliary stent removal Providers:                Justice Britain, MD, Jeanella Cara, RN,                            Cletis Athens, Technician Referring MD:             Milus Banister, MD, Maylon Peppers Medicines:                General Anesthesia Complications:            No immediate complications. Estimated Blood Loss:     Estimated blood loss was minimal. Procedure:                Pre-Anesthesia Assessment:                           - Prior to the procedure, a History and Physical                            was performed, and patient medications and                            allergies were reviewed. The patient's tolerance of                            previous anesthesia was also reviewed. The risks                            and benefits of the procedure and the sedation                            options and risks were discussed with the patient.                            All questions were answered, and informed consent                            was obtained. Prior Anticoagulants: The patient has                            taken no previous anticoagulant or antiplatelet                            agents except for aspirin. ASA Grade Assessment:                            III - A patient with severe systemic disease. After  reviewing the risks and benefits, the patient was                            deemed in satisfactory condition to undergo the                            procedure.                           After obtaining informed consent, the scope was                            passed under direct vision. Throughout the                             procedure, the patient's blood pressure, pulse, and                            oxygen saturations were monitored continuously. The                            [employer] single use                            duodenoscope was introduced through the mouth, and                            used to inject contrast into and used to inject                            contrast into the bile duct. The ERCP was                            accomplished without difficulty. The patient                            tolerated the procedure. Scope In: Scope Out: Findings:      A scout film of the abdomen was obtained. Surgical clips, consistent       with previous cholecystectomy, were seen in the area of the right upper       quadrant of the abdomen. One stent ending in the main bile duct was seen.      The esophagus was successfully intubated under direct vision without       detailed examination of the pharynx, larynx, and associated structures,       and upper GI tract. A biliary sphincterotomy had been performed. The       sphincterotomy appeared open. One plastic biliary stent originating in       the biliary tree was emerging from the major papilla. The stent was       visibly patent. One stent was removed from the biliary tree using a       snare.      A short 0.035 inch Soft Jagwire was passed into the biliary tree. The       Hydratome sphincterotome was passed over the guidewire and  the bile duct       was then deeply cannulated. Contrast was injected. I personally       interpreted the bile duct images. Ductal flow of contrast was adequate.       Image quality was adequate. Contrast extended to the hepatic ducts.       Opacification of the entire biliary tree except for the cystic duct and       gallbladder was successful. The main bile duct was moderately dilated.       The largest diameter was 13 mm. The main bile duct and right main       hepatic duct contained filling  defects thought to be stones and sludge.       The biliary sphincterotomy was extended an additional 2 mm in length       with a monofilament Hydratome sphincterotome using ERBE electrocautery.       There was no post-sphincterotomy bleeding. Dilation of the distal common       bile duct with an 01-30-09 mm x 3 cm CRE balloon (to a maximum balloon       size of 10 mm) dilator was successful as DASE . Sphincteroplasty for 2       additional minutes. The biliary tree was swept with the retrieval       balloon starting distally and gradually moving proximally to the       bifurcation. Sludge was swept from the duct. Multiple stones were       removed. A single stone was felt to still be remaining in the right       hepatic system. Each time I tried to traverse this with a retrieval       balloon it kept pushing it more proximally. I transition to the       sphincterotome and did some bowing of the sphincterotome to pull the       stone downwards into the CHD region. I then transition back to the       retrieval balloon and then was able to sweep the stone out of the duct.       No stones remained. An occlusion cholangiogram was performed that showed       no further significant biliary pathology.      A pancreatogram was not performed.      The duodenoscope was withdrawn from the patient. Impression:               - Prior biliary sphincterotomy appeared open.                           - One visibly patent stent from the biliary tree                            was seen in the major papilla. This was removed.                           - The entire main bile duct was moderately dilated.                           - Multiple filling defects consistent with stones  and sludge were seen on the cholangiogram in the                            CBD as well as the right hepatic duct.                           - Choledocholithiasis was found. Complete removal                             was accomplished by biliary sphincterotomy, balloon                            sphincteroplasty, trawling of the biliary duct with                            sphincterotome and balloon. Moderate Sedation:      Not Applicable - Patient had care per Anesthesia. Recommendation:           - The patient will be observed post-procedure,                            until all discharge criteria are met.                           - Discharge patient to home.                           - Patient has a contact number available for                            emergencies. The signs and symptoms of potential                            delayed complications were discussed with the                            patient. Return to normal activities tomorrow.                            Written discharge instructions were provided to the                            patient.                           - Low fat diet.                           - Observe patient's clinical course.                           - Check liver enzymes (AST, ALT, alkaline                            phosphatase, bilirubin) in 2-4 weeks.                           -  Watch for pancreatitis, bleeding, perforation,                            and cholangitis.                           - Recommend follow-up on liver lesion imaging as                            per primary gastroenterology team. If this remains                            a left lobe lesion that is concerning in future                            imaging, then I am willing to consider endoscopic                            ultrasound to try to visualize that area in case                            needle biopsy may be required in the future. No                            guarantees that that area would be fully visualized                            but if there is no IR approach we may need to                            consider this. Happy to be available in future.                            - The findings and recommendations were discussed                            with the patient.                           - The findings and recommendations were discussed                            with the patient's family. Procedure Code(s):        --- Professional ---                           [phone], Endoscopic retrograde                            cholangiopancreatography (ERCP); with removal of                            foreign body(s) or stent(s) from biliary/pancreatic  duct(s)                           43264, Endoscopic retrograde                            cholangiopancreatography (ERCP); with removal of                            calculi/debris from biliary/pancreatic duct(s) Diagnosis Code(s):        --- Professional ---                           Z96.89, Presence of other specified functional                            implants                           K80.50, Calculus of bile duct without cholangitis                            or cholecystitis without obstruction                           Z46.59, Encounter for fitting and adjustment of                            other gastrointestinal appliance and device                           K83.8, Other specified diseases of biliary tract                           R93.2, Abnormal findings on diagnostic imaging of                            liver and biliary tract CPT copyright 2019 American Medical Association. All rights reserved. The codes documented in this report are preliminary and upon coder review may  be revised to meet current compliance requirements. Justice Britain, MD 04/15/2022 1:24:22 PM Number of Addenda: 0

## 2022-04-15 NOTE — Anesthesia Preprocedure Evaluation (Signed)
Anesthesia Evaluation  Patient identified by MRN, date of birth, ID band Patient awake    Reviewed: Allergy & Precautions, NPO status , Patient's Chart, lab work & pertinent test results, reviewed documented beta blocker date and time   History of Anesthesia Complications Negative for: history of anesthetic complications  Airway Mallampati: II  TM Distance: >3 FB Neck ROM: Full    Dental  (+) Dental Advisory Given, Edentulous Lower, Edentulous Upper   Pulmonary neg pulmonary ROS,    Pulmonary exam normal breath sounds clear to auscultation       Cardiovascular hypertension, Pt. on home beta blockers + CAD, + Past MI and + Cardiac Stents  Normal cardiovascular exam Rhythm:Regular Rate:Normal     Neuro/Psych PSYCHIATRIC DISORDERS Anxiety negative neurological ROS     GI/Hepatic GERD  Medicated,Cholangitis, choledocholithiasis   Endo/Other  Hypothyroidism   Renal/GU negative Renal ROS     Musculoskeletal  (+) Arthritis ,   Abdominal   Peds  Hematology negative hematology ROS (+)   Anesthesia Other Findings   Reproductive/Obstetrics                             Anesthesia Physical  Anesthesia Plan  ASA: 3  Anesthesia Plan: General   Post-op Pain Management: Minimal or no pain anticipated   Induction: Intravenous  PONV Risk Score and Plan: 2 and Ondansetron, Midazolam and Treatment may vary due to age or medical condition  Airway Management Planned: Oral ETT  Additional Equipment:   Intra-op Plan:   Post-operative Plan: Extubation in OR  Informed Consent: I have reviewed the patients History and Physical, chart, labs and discussed the procedure including the risks, benefits and alternatives for the proposed anesthesia with the patient or authorized representative who has indicated his/her understanding and acceptance.     Dental advisory given  Plan Discussed with:  Anesthesiologist and CRNA  Anesthesia Plan Comments:         Anesthesia Quick Evaluation

## 2022-04-15 NOTE — Anesthesia Postprocedure Evaluation (Signed)
Anesthesia Post Note  Patient: ARAEL PICCIONE  Procedure(s) Performed: ENDOSCOPIC RETROGRADE CHOLANGIOPANCREATOGRAPHY (ERCP) WITH PROPOFOL STENT REMOVAL SPHINCTEROTOMY REMOVAL OF STONES BILIARY DILATION     Patient location during evaluation: PACU Anesthesia Type: General Level of consciousness: awake and alert Pain management: pain level controlled Vital Signs Assessment: post-procedure vital signs reviewed and stable Respiratory status: spontaneous breathing, nonlabored ventilation and respiratory function stable Cardiovascular status: blood pressure returned to baseline and stable Postop Assessment: no apparent nausea or vomiting Anesthetic complications: no   No notable events documented.  Last Vitals:  Vitals:   04/15/22 1350 04/15/22 1400  BP: (!) 177/68 (!) 175/63  Pulse: (!) 52 (!) 50  Resp: 16 14  Temp:    SpO2: 98% 99%    Last Pain:  Vitals:   04/15/22 1400  TempSrc:   PainSc: 0-No pain                 Lynda Rainwater

## 2022-04-17 ENCOUNTER — Encounter (HOSPITAL_COMMUNITY): Payer: Self-pay | Admitting: Gastroenterology

## 2022-05-23 ENCOUNTER — Encounter: Payer: Self-pay | Admitting: Nurse Practitioner

## 2022-05-23 ENCOUNTER — Ambulatory Visit: Payer: Medicare HMO | Attending: Nurse Practitioner | Admitting: Nurse Practitioner

## 2022-05-23 VITALS — BP 140/70 | HR 70 | Ht 72.0 in | Wt 157.0 lb

## 2022-05-23 DIAGNOSIS — I10 Essential (primary) hypertension: Secondary | ICD-10-CM | POA: Diagnosis not present

## 2022-05-23 DIAGNOSIS — I6523 Occlusion and stenosis of bilateral carotid arteries: Secondary | ICD-10-CM | POA: Diagnosis not present

## 2022-05-23 DIAGNOSIS — R002 Palpitations: Secondary | ICD-10-CM | POA: Diagnosis not present

## 2022-05-23 DIAGNOSIS — I77810 Thoracic aortic ectasia: Secondary | ICD-10-CM

## 2022-05-23 DIAGNOSIS — I2510 Atherosclerotic heart disease of native coronary artery without angina pectoris: Secondary | ICD-10-CM

## 2022-05-23 MED ORDER — NITROGLYCERIN 0.4 MG SL SUBL: 0.4000 mg | SUBLINGUAL_TABLET | SUBLINGUAL | 2 refills | Status: AC | PRN

## 2022-05-23 NOTE — Patient Instructions (Signed)
Medication Instructions:  Your physician recommends that you continue on your current medications as directed. Please refer to the Current Medication list given to you today.   Labwork: none  Testing/Procedures: Your physician has requested that you have an echocardiogram. Echocardiography is a painless test that uses sound waves to create images of your heart. It provides your doctor with information about the size and shape of your heart and how well your heart's chambers and valves are working. This procedure takes approximately one hour. There are no restrictions for this procedure. Please do NOT wear cologne, perfume, aftershave, or lotions (deodorant is allowed). Please arrive 15 minutes prior to your appointment time.   Follow-Up:  Your physician recommends that you schedule a follow-up appointment in: 1 year. You will receive a call in about 10 months reminding you to schedule your appointment. If you do not receive this call, please contact our office.  Any Other Special Instructions Will Be Listed Below (If Applicable).  If you need a refill on your cardiac medications before your next appointment, please call your pharmacy.
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
## 2022-06-06 ENCOUNTER — Ambulatory Visit: Payer: Medicare HMO | Attending: Nurse Practitioner

## 2022-06-06 DIAGNOSIS — I080 Rheumatic disorders of both mitral and aortic valves: Secondary | ICD-10-CM

## 2022-06-06 DIAGNOSIS — I5030 Unspecified diastolic (congestive) heart failure: Secondary | ICD-10-CM | POA: Diagnosis not present

## 2022-06-06 DIAGNOSIS — I77810 Thoracic aortic ectasia: Secondary | ICD-10-CM

## 2022-06-06 LAB — ECHOCARDIOGRAM COMPLETE
AR max vel: 2 cm2
AV Peak grad: 12.7 mmHg
Ao pk vel: 1.78 m/s
Area-P 1/2: 2.24 cm2
Calc EF: 69.9 %
MV M vel: 5.5 m/s
MV Peak grad: 121 mmHg
Radius: 0.3 cm
S' Lateral: 2.5 cm
Single Plane A2C EF: 70.6 %
Single Plane A4C EF: 68.6 %

## 2022-08-28 DIAGNOSIS — E039 Hypothyroidism, unspecified: Secondary | ICD-10-CM | POA: Diagnosis not present

## 2022-09-04 DIAGNOSIS — L409 Psoriasis, unspecified: Secondary | ICD-10-CM | POA: Diagnosis not present

## 2022-09-04 DIAGNOSIS — G4700 Insomnia, unspecified: Secondary | ICD-10-CM | POA: Diagnosis not present

## 2022-09-04 DIAGNOSIS — E039 Hypothyroidism, unspecified: Secondary | ICD-10-CM | POA: Diagnosis not present

## 2022-09-04 DIAGNOSIS — I2510 Atherosclerotic heart disease of native coronary artery without angina pectoris: Secondary | ICD-10-CM | POA: Diagnosis not present

## 2022-09-04 DIAGNOSIS — I10 Essential (primary) hypertension: Secondary | ICD-10-CM | POA: Diagnosis not present

## 2022-09-04 DIAGNOSIS — Z6820 Body mass index (BMI) 20.0-20.9, adult: Secondary | ICD-10-CM | POA: Diagnosis not present

## 2022-09-04 DIAGNOSIS — K769 Liver disease, unspecified: Secondary | ICD-10-CM | POA: Diagnosis not present

## 2022-09-04 DIAGNOSIS — L989 Disorder of the skin and subcutaneous tissue, unspecified: Secondary | ICD-10-CM | POA: Diagnosis not present

## 2022-09-04 DIAGNOSIS — Z9049 Acquired absence of other specified parts of digestive tract: Secondary | ICD-10-CM | POA: Diagnosis not present

## 2022-09-05 ENCOUNTER — Encounter (INDEPENDENT_AMBULATORY_CARE_PROVIDER_SITE_OTHER): Payer: Self-pay | Admitting: Gastroenterology

## 2022-09-05 ENCOUNTER — Ambulatory Visit (INDEPENDENT_AMBULATORY_CARE_PROVIDER_SITE_OTHER): Payer: Medicare HMO | Admitting: Gastroenterology

## 2022-09-05 VITALS — BP 120/67 | HR 87 | Temp 98.8°F | Ht 72.0 in | Wt 158.0 lb

## 2022-09-05 DIAGNOSIS — R140 Abdominal distension (gaseous): Secondary | ICD-10-CM

## 2022-09-05 DIAGNOSIS — K769 Liver disease, unspecified: Secondary | ICD-10-CM | POA: Diagnosis not present

## 2022-09-05 MED ORDER — SIMETHICONE 80 MG PO CHEW: 80.0000 mg | CHEWABLE_TABLET | Freq: Four times a day (QID) | ORAL | 3 refills | Status: DC | PRN
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
## 2022-09-05 NOTE — Patient Instructions (Signed)
I have sent a refill of simethicone for you to use We will schedule a repeat CT scan of the liver to continue to follow the liver lesion previously seen, further recommendations to follow once I have the results of this We will update liver function test as this was not completed as recommended after your last ERCP  Follow up 1 year   It was a pleasure to see you today. I want to create trusting relationships with patients and provide genuine, compassionate, and quality care. I truly value your feedback! please be on the lookout for a survey regarding your visit with me today. I appreciate your input about our visit and your time in completing this!    Flavius Repsher L. Alver Sorrow, MSN, APRN, AGNP-C Adult-Gerontology Nurse Practitioner [facility] Gastroenterology at [facility]

## 2022-09-06 LAB — HEPATIC FUNCTION PANEL
AG Ratio: 2.1 (calc) (ref 1.0–2.5)
ALT: 358 U/L — ABNORMAL HIGH (ref 9–46)
AST: 243 U/L — ABNORMAL HIGH (ref 10–35)
Albumin: 4 g/dL (ref 3.6–5.1)
Alkaline phosphatase (APISO): 125 U/L (ref 35–144)
Bilirubin, Direct: 3.3 mg/dL — ABNORMAL HIGH (ref 0.0–0.2)
Globulin: 1.9 g/dL (calc) (ref 1.9–3.7)
Indirect Bilirubin: 3 mg/dL (calc) — ABNORMAL HIGH (ref 0.2–1.2)
Total Bilirubin: 6.3 mg/dL — ABNORMAL HIGH (ref 0.2–1.2)
Total Protein: 5.9 g/dL — ABNORMAL LOW (ref 6.1–8.1)

## 2022-09-06 NOTE — Addendum Note (Signed)
Addended by: Harvel Quale on: 09/06/2022 03:28 PM   Modules accepted: Level of Service

## 2022-09-09 ENCOUNTER — Telehealth (INDEPENDENT_AMBULATORY_CARE_PROVIDER_SITE_OTHER): Payer: Self-pay | Admitting: *Deleted

## 2022-09-09 ENCOUNTER — Encounter (INDEPENDENT_AMBULATORY_CARE_PROVIDER_SITE_OTHER): Payer: Self-pay

## 2022-09-09 NOTE — Telephone Encounter (Signed)
Discussed with patient per Vikki Ports - I have reviewed the labs, I am going to discuss findings with Dr. Rush Landmark who did his Last ERCP to get his thoughts on this, can we find out if Herbert Carr is having any RUQ pain/nausea or vomiting as he was asymptomatic in clinic the other day.   Patient states no nausea or vomiting and no usual pain in RUQ. Reports he has arthritis on his right side but pain is the same as he always has.

## 2022-09-09 NOTE — Telephone Encounter (Signed)
Janna Arch called about patients lab results. She saw online and was concerned about the results she saw and wonders if he needs to go over to hospital.  Can call her at [phone] or him at [phone]

## 2022-09-09 NOTE — Telephone Encounter (Signed)
Called and discussed with patient per chelsea - Okay I have consulted Dr. Rush Landmark, he will be back in the office tomorrow, let's have them just hang tight until I hear back unless he develops any RUQ pain/nausea/vomiting or fevers, if he has any of these symptoms, he should proceed to [facility] ER as we cannot perform ERCPs at [facility] I will be in touch as soon as I hear back from Dr. Jerilynn Mages.         patient verbalized understanding to go to ED if he starts to having any RUQ pain, nausea, vomiting or fevers.

## 2022-09-09 NOTE — Telephone Encounter (Signed)
I called patient because I spoke with him today about results and he told me that he did not send that mychart message it must of been his granddaughter. She is not on his release. I let him know he could add her to release if he wanted [place] to speak with her. He verbalized understanding of all.

## 2022-09-14 ENCOUNTER — Other Ambulatory Visit (INDEPENDENT_AMBULATORY_CARE_PROVIDER_SITE_OTHER): Payer: Self-pay | Admitting: Gastroenterology

## 2022-09-14 DIAGNOSIS — K8050 Calculus of bile duct without cholangitis or cholecystitis without obstruction: Secondary | ICD-10-CM

## 2022-09-14 DIAGNOSIS — R748 Abnormal levels of other serum enzymes: Secondary | ICD-10-CM

## 2022-09-16 ENCOUNTER — Telehealth (INDEPENDENT_AMBULATORY_CARE_PROVIDER_SITE_OTHER): Payer: Self-pay | Admitting: *Deleted

## 2022-09-16 ENCOUNTER — Other Ambulatory Visit (INDEPENDENT_AMBULATORY_CARE_PROVIDER_SITE_OTHER): Payer: Self-pay | Admitting: *Deleted

## 2022-09-16 DIAGNOSIS — K8050 Calculus of bile duct without cholangitis or cholecystitis without obstruction: Secondary | ICD-10-CM

## 2022-09-16 DIAGNOSIS — R748 Abnormal levels of other serum enzymes: Secondary | ICD-10-CM

## 2022-09-16 NOTE — Telephone Encounter (Signed)
Discussed with patient per Scherrie Gerlach, Deatra Robinson, NP  Carmelina Noun, LPN; Vicente Males, LPN Abigail Butts, please let patient know I spoke with dr Rush Landmark who recommended we get a CT scan and repeat LFTs next week, he is going to go ahead and get a spot set up for possible ERCP in April for if CT shows stones again. I will order LFTs for him to complete at his convenience Monday or tuesday  Lavella Lemons, can we get the patient scheduled for CT Abdomen with contrast STAT, diagnosis is elevated LFTs with history of choledocolithiasis.       Patient declined to schedule CT or do labs at this time. Reports he is not having any abdominal pain and does not want to do testing. Advised to call back if he changes his mind and wants to do testing that is recommended.

## 2022-09-16 NOTE — Telephone Encounter (Signed)
Discussed with patient per [facility] -  make the patient aware this is at the recommendation of Dr. Rush Landmark, given his recurrence of obstructive gallstones, his liver enzyme elevation is concerning for recurrence of this which can cause serious adverse events if this is not evaluated and taken care of. I would urge him to reconsider further evaluation with labs and CT imaging.   Patient still declines to have ct scan and LFT's done. Advised to call back if he changes his mind.

## 2022-09-16 NOTE — Telephone Encounter (Signed)
Herbert Rung, NP  Herbert Noun, LPN; Herbert Males, LPN Herbert Carr, please let patient know I spoke with dr Rush Landmark who recommended we get a CT scan and repeat LFTs next week, he is going to go ahead and get a spot set up for possible ERCP in April for if CT shows stones again. I will order LFTs for him to complete at his convenience Monday or tuesday  Herbert Carr, can we get the patient scheduled for CT Abdomen with contrast STAT, diagnosis is elevated LFTs with history of choledocolithiasis.   Discussed all with patient. And he declines to schedule ct abdomen at this time and he also declined to have LFT's done at this time. Reports he is not having pain. Advised him he needs to have done and to call back if he changes his mind or starts to have pain.

## 2022-09-17 ENCOUNTER — Telehealth (INDEPENDENT_AMBULATORY_CARE_PROVIDER_SITE_OTHER): Payer: Self-pay | Admitting: Gastroenterology

## 2022-09-17 ENCOUNTER — Encounter (INDEPENDENT_AMBULATORY_CARE_PROVIDER_SITE_OTHER): Payer: Self-pay

## 2022-09-17 NOTE — Telephone Encounter (Signed)
error 

## 2022-09-18 ENCOUNTER — Other Ambulatory Visit: Payer: Self-pay

## 2022-09-18 ENCOUNTER — Telehealth: Payer: Self-pay

## 2022-09-18 DIAGNOSIS — R748 Abnormal levels of other serum enzymes: Secondary | ICD-10-CM

## 2022-09-18 DIAGNOSIS — K8050 Calculus of bile duct without cholangitis or cholecystitis without obstruction: Secondary | ICD-10-CM

## 2022-09-18 NOTE — Telephone Encounter (Signed)
FYI- Dr Rush Landmark  Patient has been tentatively scheduled for ERCP on 10/14/22 @ [facility], pending results of his CT scan that is scheduled for 10/09/22. I called patient and advised him that if ERCP is not needed then we can cancel the procedure. I explained to patient that Dr. Donneta Romberg hospital schedule fills fast and we wanted to make sure that we had a spot available for him in the event that the ERCP would be needed. Patient voiced understanding. Instructions are in patients chart as well as they will be mailed to patient.

## 2022-09-18 NOTE — Telephone Encounter (Signed)
Thanks Rovonda

## 2022-09-23 ENCOUNTER — Telehealth: Payer: Self-pay

## 2022-09-23 NOTE — Telephone Encounter (Signed)
See 3/27 note per Rovonda Beatty. Appt has been scheduled.
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
## 2022-10-02 DIAGNOSIS — E039 Hypothyroidism, unspecified: Secondary | ICD-10-CM | POA: Diagnosis not present

## 2022-10-02 DIAGNOSIS — Z6820 Body mass index (BMI) 20.0-20.9, adult: Secondary | ICD-10-CM | POA: Diagnosis not present

## 2022-10-02 DIAGNOSIS — Z9049 Acquired absence of other specified parts of digestive tract: Secondary | ICD-10-CM | POA: Diagnosis not present

## 2022-10-02 DIAGNOSIS — L989 Disorder of the skin and subcutaneous tissue, unspecified: Secondary | ICD-10-CM | POA: Diagnosis not present

## 2022-10-02 DIAGNOSIS — K769 Liver disease, unspecified: Secondary | ICD-10-CM | POA: Diagnosis not present

## 2022-10-02 DIAGNOSIS — I2510 Atherosclerotic heart disease of native coronary artery without angina pectoris: Secondary | ICD-10-CM | POA: Diagnosis not present

## 2022-10-02 DIAGNOSIS — G4700 Insomnia, unspecified: Secondary | ICD-10-CM | POA: Diagnosis not present

## 2022-10-02 DIAGNOSIS — L409 Psoriasis, unspecified: Secondary | ICD-10-CM | POA: Diagnosis not present

## 2022-10-02 DIAGNOSIS — I10 Essential (primary) hypertension: Secondary | ICD-10-CM | POA: Diagnosis not present

## 2022-10-07 ENCOUNTER — Encounter (HOSPITAL_COMMUNITY): Payer: Self-pay | Admitting: Gastroenterology

## 2022-10-09 ENCOUNTER — Ambulatory Visit (HOSPITAL_COMMUNITY)
Admission: RE | Admit: 2022-10-09 | Discharge: 2022-10-09 | Disposition: A | Discharge status: Home / Self Care | Payer: Medicare HMO | Source: Ambulatory Visit | Attending: Gastroenterology | Admitting: Gastroenterology

## 2022-10-09 DIAGNOSIS — K769 Liver disease, unspecified: Secondary | ICD-10-CM | POA: Diagnosis not present

## 2022-10-09 MED ORDER — IOHEXOL 350 MG/ML SOLN
100.0000 mL | Freq: Once | INTRAVENOUS | Status: AC | PRN
  Administered 2022-10-09: 100 mL via INTRAVENOUS

## 2022-10-11 ENCOUNTER — Encounter (INDEPENDENT_AMBULATORY_CARE_PROVIDER_SITE_OTHER): Payer: Self-pay | Admitting: Gastroenterology

## 2022-10-11 ENCOUNTER — Other Ambulatory Visit (INDEPENDENT_AMBULATORY_CARE_PROVIDER_SITE_OTHER): Payer: Self-pay

## 2022-10-11 ENCOUNTER — Telehealth (INDEPENDENT_AMBULATORY_CARE_PROVIDER_SITE_OTHER): Payer: Self-pay

## 2022-10-11 ENCOUNTER — Other Ambulatory Visit: Payer: Self-pay

## 2022-10-11 ENCOUNTER — Telehealth: Payer: Self-pay | Admitting: Gastroenterology

## 2022-10-11 DIAGNOSIS — R140 Abdominal distension (gaseous): Secondary | ICD-10-CM | POA: Diagnosis not present

## 2022-10-11 DIAGNOSIS — K8050 Calculus of bile duct without cholangitis or cholecystitis without obstruction: Secondary | ICD-10-CM

## 2022-10-11 DIAGNOSIS — K831 Obstruction of bile duct: Secondary | ICD-10-CM

## 2022-10-11 DIAGNOSIS — K8309 Other cholangitis: Secondary | ICD-10-CM | POA: Diagnosis not present

## 2022-10-11 DIAGNOSIS — R748 Abnormal levels of other serum enzymes: Secondary | ICD-10-CM

## 2022-10-11 DIAGNOSIS — K769 Liver disease, unspecified: Secondary | ICD-10-CM

## 2022-10-11 NOTE — Telephone Encounter (Signed)
Per Doylene Bode, Np (on secure chat) Can we see if he can get a cmp drawn today. I have placed the order. Dr Meridee Score wants it so when his ct comes back we have those if we need to proceed with another ercp   ill try to reach the patient, it is not stat is it?    Raquel James, NP I didn't order it that way but probably should have  8 mins Do you want me to place it as STAT   - Yes    ok   Spoke with the patient and I have placed the lab in computer for a stat Cmp sent to [facility] as I felt [facility] may turn him away for no appointment. He says he will go on over to [facility].

## 2022-10-12 LAB — COMPREHENSIVE METABOLIC PANEL
ALT: 114 IU/L — ABNORMAL HIGH (ref 0–44)
AST: 69 IU/L — ABNORMAL HIGH (ref 0–40)
Albumin/Globulin Ratio: 2.1 (ref 1.2–2.2)
Albumin: 4.2 g/dL (ref 3.7–4.7)
Alkaline Phosphatase: 224 IU/L — ABNORMAL HIGH (ref 44–121)
BUN/Creatinine Ratio: 13 (ref 10–24)
BUN: 10 mg/dL (ref 8–27)
Bilirubin Total: 1 mg/dL (ref 0.0–1.2)
CO2: 25 mmol/L (ref 20–29)
Calcium: 9.1 mg/dL (ref 8.6–10.2)
Chloride: 102 mmol/L (ref 96–106)
Creatinine, Ser: 0.78 mg/dL (ref 0.76–1.27)
Globulin, Total: 2 g/dL (ref 1.5–4.5)
Glucose: 89 mg/dL (ref 70–99)
Potassium: 3.7 mmol/L (ref 3.5–5.2)
Sodium: 140 mmol/L (ref 134–144)
Total Protein: 6.2 g/dL (ref 6.0–8.5)
eGFR: 89 mL/min/{1.73_m2} (ref >59)

## 2022-10-12 NOTE — Telephone Encounter (Signed)
Called patient 10/11/2022 afternoon. Actually to call [facility] radiology to try to get the CT scan from 2 days ago actually read as it was still in the pending queue. CT scan does show no overt choledocholithiasis or biliary issues.  Even the previous liver lesion is felt to be a shunt rather than a mass/lesion. As being said, the patient did go for laboratories on Friday no access to them since they were done through [facility] or [facility]. I told the patient I still tentatively want him to come in for procedure on 4/22. With this being said the plan is as follows:  1) we need to get the patient's labs from Friday ASAP Monday morning 2) if the LFTs are still abnormal, I would recommend a quick ERCP with biliary cleanout to ensure that sludge and debris is not a reason for the abnormal liver tests 3) if LFTs are completely normal, then we will cancel the patient's ERCP and he will follow-up with his  GI team for further evaluation 4) Patty, please reach out to patient before he has to leave the hospital on Monday   Corliss Parish, MD [facility] Gastroenterology Advanced Endoscopy Office # [phone]

## 2022-10-13 NOTE — Telephone Encounter (Signed)
Thank you all for this. I think even though the LFTs are better, with his history we will do 1 final clear out of the biliary tree. Otherwise, he may require a liver biopsy at some point in the future if things do not improve further.  Patty, Please call patient on Monday and let him know we all recommend moving forward with the ERCP as planned on Monday afternoon. Thank you. GM

## 2022-10-14 ENCOUNTER — Ambulatory Visit (HOSPITAL_COMMUNITY): Payer: Medicare HMO

## 2022-10-14 ENCOUNTER — Ambulatory Visit (HOSPITAL_COMMUNITY)
Admission: RE | Admit: 2022-10-14 | Discharge: 2022-10-14 | Disposition: A | Discharge status: Home / Self Care | Payer: Medicare HMO | Attending: Gastroenterology | Admitting: Gastroenterology

## 2022-10-14 ENCOUNTER — Ambulatory Visit (HOSPITAL_COMMUNITY): Payer: Medicare HMO | Admitting: Anesthesiology

## 2022-10-14 ENCOUNTER — Other Ambulatory Visit: Payer: Self-pay

## 2022-10-14 ENCOUNTER — Encounter (HOSPITAL_COMMUNITY)
Admission: RE | Disposition: A | Discharge status: Home / Self Care | Payer: Self-pay | Source: Home / Self Care | Attending: Gastroenterology

## 2022-10-14 ENCOUNTER — Ambulatory Visit (HOSPITAL_BASED_OUTPATIENT_CLINIC_OR_DEPARTMENT_OTHER): Payer: Medicare HMO | Admitting: Anesthesiology

## 2022-10-14 ENCOUNTER — Encounter (HOSPITAL_COMMUNITY): Payer: Self-pay | Admitting: Gastroenterology

## 2022-10-14 DIAGNOSIS — K838 Other specified diseases of biliary tract: Secondary | ICD-10-CM | POA: Insufficient documentation

## 2022-10-14 DIAGNOSIS — I10 Essential (primary) hypertension: Secondary | ICD-10-CM

## 2022-10-14 DIAGNOSIS — K831 Obstruction of bile duct: Secondary | ICD-10-CM | POA: Diagnosis not present

## 2022-10-14 DIAGNOSIS — R935 Abnormal findings on diagnostic imaging of other abdominal regions, including retroperitoneum: Secondary | ICD-10-CM | POA: Diagnosis not present

## 2022-10-14 DIAGNOSIS — K219 Gastro-esophageal reflux disease without esophagitis: Secondary | ICD-10-CM | POA: Insufficient documentation

## 2022-10-14 DIAGNOSIS — I2510 Atherosclerotic heart disease of native coronary artery without angina pectoris: Secondary | ICD-10-CM | POA: Insufficient documentation

## 2022-10-14 DIAGNOSIS — I252 Old myocardial infarction: Secondary | ICD-10-CM | POA: Diagnosis not present

## 2022-10-14 DIAGNOSIS — R17 Unspecified jaundice: Secondary | ICD-10-CM | POA: Insufficient documentation

## 2022-10-14 DIAGNOSIS — R7989 Other specified abnormal findings of blood chemistry: Secondary | ICD-10-CM | POA: Insufficient documentation

## 2022-10-14 DIAGNOSIS — Z9889 Other specified postprocedural states: Secondary | ICD-10-CM | POA: Diagnosis not present

## 2022-10-14 DIAGNOSIS — K8050 Calculus of bile duct without cholangitis or cholecystitis without obstruction: Secondary | ICD-10-CM

## 2022-10-14 DIAGNOSIS — E039 Hypothyroidism, unspecified: Secondary | ICD-10-CM | POA: Insufficient documentation

## 2022-10-14 DIAGNOSIS — R748 Abnormal levels of other serum enzymes: Secondary | ICD-10-CM

## 2022-10-14 HISTORY — PX: BALLOON DILATION: SHX5330

## 2022-10-14 HISTORY — PX: REMOVAL OF STONES: SHX5545

## 2022-10-14 HISTORY — PX: ENDOSCOPIC RETROGRADE CHOLANGIOPANCREATOGRAPHY (ERCP) WITH PROPOFOL: SHX5810

## 2022-10-14 SURGERY — ENDOSCOPIC RETROGRADE CHOLANGIOPANCREATOGRAPHY (ERCP) WITH PROPOFOL
Anesthesia: General

## 2022-10-14 MED ORDER — FENTANYL CITRATE (PF) 100 MCG/2ML IJ SOLN
INTRAMUSCULAR | Status: AC
  Filled 2022-10-14: qty 2

## 2022-10-14 MED ORDER — LACTATED RINGERS IV SOLN: INTRAVENOUS | Status: DC

## 2022-10-14 MED ORDER — GLUCAGON HCL RDNA (DIAGNOSTIC) 1 MG IJ SOLR
INTRAMUSCULAR | Status: DC | PRN
  Administered 2022-10-14 (×2): .25 mg via INTRAVENOUS

## 2022-10-14 MED ORDER — SODIUM CHLORIDE 0.9 % IV SOLN
1.5000 g | Freq: Once | INTRAVENOUS | Status: AC
  Administered 2022-10-14: 1.5 g via INTRAVENOUS
  Filled 2022-10-14: qty 4

## 2022-10-14 MED ORDER — GLUCAGON HCL RDNA (DIAGNOSTIC) 1 MG IJ SOLR
INTRAMUSCULAR | Status: AC
  Filled 2022-10-14: qty 1

## 2022-10-14 MED ORDER — SODIUM CHLORIDE 0.9 % IV SOLN
INTRAVENOUS | Status: DC | PRN
  Administered 2022-10-14: 20 mL

## 2022-10-14 MED ORDER — ROCURONIUM BROMIDE 10 MG/ML (PF) SYRINGE
PREFILLED_SYRINGE | INTRAVENOUS | Status: DC | PRN
  Administered 2022-10-14: 50 mg via INTRAVENOUS

## 2022-10-14 MED ORDER — SODIUM CHLORIDE 0.9 % IV SOLN: INTRAVENOUS | Status: DC

## 2022-10-14 MED ORDER — SUGAMMADEX SODIUM 200 MG/2ML IV SOLN
INTRAVENOUS | Status: DC | PRN
  Administered 2022-10-14: 150 mg via INTRAVENOUS

## 2022-10-14 MED ORDER — DICLOFENAC SUPPOSITORY 100 MG
RECTAL | Status: AC
  Filled 2022-10-14: qty 1

## 2022-10-14 MED ORDER — DEXAMETHASONE SODIUM PHOSPHATE 10 MG/ML IJ SOLN
INTRAMUSCULAR | Status: DC | PRN
  Administered 2022-10-14: 10 mg via INTRAVENOUS

## 2022-10-14 MED ORDER — PROPOFOL 10 MG/ML IV BOLUS
INTRAVENOUS | Status: DC | PRN
  Administered 2022-10-14: 100 mg via INTRAVENOUS
  Administered 2022-10-14: 50 mg via INTRAVENOUS

## 2022-10-14 MED ORDER — LIDOCAINE 2% (20 MG/ML) 5 ML SYRINGE
INTRAMUSCULAR | Status: DC | PRN
  Administered 2022-10-14: 40 mg via INTRAVENOUS

## 2022-10-14 MED ORDER — ONDANSETRON HCL 4 MG/2ML IJ SOLN
INTRAMUSCULAR | Status: DC | PRN
  Administered 2022-10-14: 4 mg via INTRAVENOUS

## 2022-10-14 MED ORDER — FENTANYL CITRATE (PF) 100 MCG/2ML IJ SOLN
INTRAMUSCULAR | Status: DC | PRN
  Administered 2022-10-14: 25 ug via INTRAVENOUS

## 2022-10-14 MED ORDER — DICLOFENAC SUPPOSITORY 100 MG
RECTAL | Status: DC | PRN
  Administered 2022-10-14: 100 mg via RECTAL

## 2022-10-14 NOTE — Anesthesia Postprocedure Evaluation (Signed)
Anesthesia Post Note  Patient: Herbert Carr  Procedure(s) Performed: ENDOSCOPIC RETROGRADE CHOLANGIOPANCREATOGRAPHY (ERCP) WITH PROPOFOL REMOVAL OF STONES BALLOON DILATION     Patient location during evaluation: PACU Anesthesia Type: General Level of consciousness: awake and alert Pain management: pain level controlled Vital Signs Assessment: post-procedure vital signs reviewed and stable Respiratory status: spontaneous breathing, nonlabored ventilation, respiratory function stable and patient connected to nasal cannula oxygen Cardiovascular status: blood pressure returned to baseline and stable Postop Assessment: no apparent nausea or vomiting Anesthetic complications: no  No notable events documented.  Last Vitals:  Vitals:   10/14/22 1430 10/14/22 1440  BP: (!) 171/78 (!) 174/69  Pulse: (!) 58 (!) 58  Resp: 16 15  Temp:    SpO2: 100% 100%    Last Pain:  Vitals:   10/14/22 1440  TempSrc:   PainSc: 0-No pain                 Shelton Silvas

## 2022-10-14 NOTE — Op Note (Signed)
[facility] Patient Name: Herbert Carr Procedure Date: 10/14/2022 MRN: [id number] Attending MD: Corliss Parish , MD, [id number] Date of Birth: 1941/01/15 CSN: [id number] Age: 82 Admit Type: Outpatient Procedure:                ERCP Indications:              Abnormal abdominal CT, Evaluation and possible                            treatment of bile duct stone(s), Jaundice, Abnormal                            liver function test, Prior Endoscopic Retrograde                            Cholangiopancreatography Providers:                Corliss Parish, MD, Stephens Shire RN, RN, Norman Clay, RN, Harrington Challenger, Technician Referring MD:             Katrinka Blazing, Philbert Riser. Williams Medicines:                General Anesthesia, Unasyn 1.5 g IV, Diclofenac 100                            mg rectal, Glucagon 0.5 mg IV Complications:            No immediate complications. Estimated Blood Loss:     Estimated blood loss was minimal. Procedure:                Pre-Anesthesia Assessment:                           - Prior to the procedure, a History and Physical                            was performed, and patient medications and                            allergies were reviewed. The patient's tolerance of                            previous anesthesia was also reviewed. The risks                            and benefits of the procedure and the sedation                            options and risks were discussed with the patient.                            All questions were answered, and informed consent  was obtained. Prior Anticoagulants: The patient has                            taken no anticoagulant or antiplatelet agents                            except for aspirin. ASA Grade Assessment: III - A                            patient with severe systemic disease. After                            reviewing the risks and  benefits, the patient was                            deemed in satisfactory condition to undergo the                            procedure.                           After obtaining informed consent, the scope was                            passed under direct vision. Throughout the                            procedure, the patient's blood pressure, pulse, and                            oxygen saturations were monitored continuously. The                            [employer] single use                            duodenoscope was introduced through the mouth, and                            used to inject contrast into and used to inject                            contrast into the bile duct. The ERCP was                            accomplished without difficulty. The patient                            tolerated the procedure. Scope In: Scope Out: Findings:      A scout film of the abdomen was obtained. Surgical clips, consistent       with a previous cholecystectomy, were seen in the area of the right       upper quadrant of the abdomen.      The esophagus was successfully  intubated under direct vision without       detailed examination of the pharynx, larynx, and associated structures,       and upper GI tract. A biliary sphincterotomy had been performed. The       sphincterotomy appeared open.      A short 0.035 inch Soft Jagwire was passed into the biliary tree. The       Hydratome sphincterotome was passed over the guidewire and the bile duct       was then deeply cannulated. Contrast was injected. I personally       interpreted the bile duct images. Ductal flow of contrast was adequate.       Image quality was adequate. Contrast extended to the hepatic ducts.       Opacification of the entire biliary tree except for the gallbladder was       successful. The main bile duct was mildly dilated. The largest diameter       was 11 mm. The lower third of the main bile  duct contained filling       defect thought to be sludge. Dilation of the distal common bile duct       with an 01-30-09 mm balloon (to a maximum balloon size of 10 mm) dilator       was successful for total of 1-1/2 minutes as a sphincteroplasty. The       biliary tree was swept with a retrieval balloon. Sludge was swept from       the duct. An occlusion cholangiogram was performed that showed no       further significant biliary pathology.      A pancreatogram was not performed.      The duodenoscope was withdrawn from the patient. Impression:               - Prior biliary sphincterotomy appeared open.                           - The fluoroscopic examination was suspicious for                            sludge.                           - The entire main bile duct was mildly dilated.                           - Sphincteroplasty performed up to 10 mm.                           - The biliary tree was swept and sludge was found. Moderate Sedation:      Not Applicable - Patient had care per Anesthesia. Recommendation:           - The patient will be observed post-procedure,                            until all discharge criteria are met.                           - Discharge patient to home.                           -  Patient has a contact number available for                            emergencies. The signs and symptoms of potential                            delayed complications were discussed with the                            patient. Return to normal activities tomorrow.                            Written discharge instructions were provided to the                            patient.                           - Low fat diet.                           - Observe patient's clinical course.                           - Check liver enzymes (AST, ALT, alkaline                            phosphatase, bilirubin) in 2 weeks.                           - Watch for pancreatitis, bleeding,  perforation,                            and cholangitis.                           - Follow-up with primary gastroenterology team.                           - The findings and recommendations were discussed                            with the patient.                           - The findings and recommendations were discussed                            with the patient's family. Procedure Code(s):        --- Professional ---                           [phone], 59, Endoscopic retrograde                            cholangiopancreatography (ERCP); with  trans-endoscopic balloon dilation of                            biliary/pancreatic duct(s) or of ampulla                            (sphincteroplasty), including sphincterotomy, when                            performed, each duct                           43264, Endoscopic retrograde                            cholangiopancreatography (ERCP); with removal of                            calculi/debris from biliary/pancreatic duct(s)                           74328, 26, Endoscopic catheterization of the                            biliary ductal system, radiological supervision and                            interpretation Diagnosis Code(s):        --- Professional ---                           K80.50, Calculus of bile duct without cholangitis                            or cholecystitis without obstruction                           R17, Unspecified jaundice                           R79.89, Other specified abnormal findings of blood                            chemistry                           Z98.890, Other specified postprocedural states                           K83.8, Other specified diseases of biliary tract                           R93.5, Abnormal findings on diagnostic imaging of                            other abdominal regions, including retroperitoneum CPT copyright 2022 [employer]. All  rights reserved. The codes documented in this report are preliminary and upon coder review may  be revised to meet current compliance requirements. Corliss Parish, MD 10/14/2022 2:24:22 PM Number of Addenda: 0

## 2022-10-14 NOTE — Telephone Encounter (Signed)
No worries will take care of him later today. GM

## 2022-10-14 NOTE — Telephone Encounter (Signed)
The pt has been advised to keep appt as planned for today. The pt has been advised of the information and verbalized understanding.

## 2022-10-14 NOTE — Anesthesia Preprocedure Evaluation (Addendum)
Anesthesia Evaluation  Patient identified by MRN, date of birth, ID band Patient awake    Reviewed: Allergy & Precautions, NPO status , Patient's Chart, lab work & pertinent test results  Airway Mallampati: II  TM Distance: >3 FB Neck ROM: Full    Dental  (+) Edentulous Upper, Edentulous Lower   Pulmonary neg pulmonary ROS   breath sounds clear to auscultation       Cardiovascular hypertension, + CAD and + Past MI   Rhythm:Regular Rate:Normal     Neuro/Psych   Anxiety     negative neurological ROS     GI/Hepatic Neg liver ROS,GERD  ,,  Endo/Other  Hypothyroidism    Renal/GU negative Renal ROS     Musculoskeletal  (+) Arthritis ,    Abdominal   Peds  Hematology negative hematology ROS (+)   Anesthesia Other Findings   Reproductive/Obstetrics                             Anesthesia Physical Anesthesia Plan  ASA: 3  Anesthesia Plan: General   Post-op Pain Management: Minimal or no pain anticipated   Induction: Intravenous  PONV Risk Score and Plan: 2 and Ondansetron and Treatment may vary due to age or medical condition  Airway Management Planned: Oral ETT  Additional Equipment: None  Intra-op Plan:   Post-operative Plan: Extubation in OR  Informed Consent: I have reviewed the patients History and Physical, chart, labs and discussed the procedure including the risks, benefits and alternatives for the proposed anesthesia with the patient or authorized representative who has indicated his/her understanding and acceptance.     Dental advisory given  Plan Discussed with: CRNA  Anesthesia Plan Comments:        Anesthesia Quick Evaluation
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
## 2022-10-14 NOTE — Anesthesia Procedure Notes (Signed)
Procedure Name: Intubation Date/Time: 10/14/2022 1:28 PM  Performed by: Florene Route, CRNAPre-anesthesia Checklist: Patient identified, Emergency Drugs available, Suction available and Patient being monitored Patient Re-evaluated:Patient Re-evaluated prior to induction Oxygen Delivery Method: Circle system utilized Preoxygenation: Pre-oxygenation with 100% oxygen Induction Type: IV induction Ventilation: Mask ventilation without difficulty Laryngoscope Size: Miller and 3 Grade View: Grade I Tube type: Oral Tube size: 7.5 mm Number of attempts: 1 Airway Equipment and Method: Stylet Placement Confirmation: ETT inserted through vocal cords under direct vision, positive ETCO2 and breath sounds checked- equal and bilateral Secured at: 21 cm Tube secured with: Tape Dental Injury: Teeth and Oropharynx as per pre-operative assessment

## 2022-10-14 NOTE — Discharge Instructions (Signed)
Restart your aspirin tomorrow (10/15/22). You can resume any other medications this evening.  YOU HAD AN ENDOSCOPIC PROCEDURE TODAY: Refer to the procedure report and other information in the discharge instructions given to you for any specific questions about what was found during the examination. If this information does not answer your questions, please call [place] office at [phone] to clarify.   YOU SHOULD EXPECT: Some feelings of bloating in the abdomen. Passage of more gas than usual. Walking can help get rid of the air that was put into your GI tract during the procedure and reduce the bloating.  DIET: Your first meal following the procedure should be a light meal and then it is ok to progress to your normal diet. A half-sandwich or bowl of soup is an example of a good first meal. Heavy or fried foods are harder to digest and may make you feel nauseous or bloated. Drink plenty of fluids but you should avoid alcoholic beverages for 24 hours.   ACTIVITY: Your care partner should take you home directly after the procedure. You should plan to take it easy, moving slowly for the rest of the day. You can resume normal activity the day after the procedure however YOU SHOULD NOT DRIVE, use power tools, machinery or perform tasks that involve climbing or major physical exertion for 24 hours (because of the sedation medicines used during the test).   SYMPTOMS TO REPORT IMMEDIATELY: A gastroenterologist can be reached at any hour. Please call [phone]  for any of the following symptoms:   Following upper endoscopy (EGD, EUS, ERCP, esophageal dilation) Vomiting of blood or coffee ground material  New, significant abdominal pain  New, significant chest pain or pain under the shoulder blades  Painful or persistently difficult swallowing  New shortness of breath  Black, tarry-looking or red, bloody stools  FOLLOW UP:  If any biopsies were taken you will be contacted by phone or by letter  within the next 1-3 weeks. Call [phone]  if you have not heard about the biopsies in 3 weeks.  Please also call with any specific questions about appointments or follow up tests.

## 2022-10-14 NOTE — Transfer of Care (Signed)
Immediate Anesthesia Transfer of Care Note  Patient: DETRELL UMSCHEID  Procedure(s) Performed: ENDOSCOPIC RETROGRADE CHOLANGIOPANCREATOGRAPHY (ERCP) WITH PROPOFOL REMOVAL OF STONES BALLOON DILATION  Patient Location: Endoscopy Unit  Anesthesia Type:General  Level of Consciousness: drowsy  Airway & Oxygen Therapy: Patient Spontanous Breathing and Patient connected to face mask oxygen  Post-op Assessment: Report given to RN and Post -op Vital signs reviewed and stable  Post vital signs: Reviewed and stable  Last Vitals:  Vitals Value Taken Time  BP    Temp    Pulse 62 10/14/22 1422  Resp 13 10/14/22 1422  SpO2 100 % 10/14/22 1422  Vitals shown include unvalidated device data.  Last Pain:  Vitals:   10/14/22 1148  TempSrc: Temporal  PainSc: 0-No pain         Complications: No notable events documented.

## 2022-10-18 ENCOUNTER — Encounter (HOSPITAL_COMMUNITY): Payer: Self-pay | Admitting: Gastroenterology

## 2022-11-01 ENCOUNTER — Other Ambulatory Visit (HOSPITAL_COMMUNITY): Payer: Medicare HMO

## 2023-02-25 DIAGNOSIS — L821 Other seborrheic keratosis: Secondary | ICD-10-CM | POA: Diagnosis not present

## 2023-03-06 DIAGNOSIS — K769 Liver disease, unspecified: Secondary | ICD-10-CM | POA: Diagnosis not present

## 2023-03-06 DIAGNOSIS — I10 Essential (primary) hypertension: Secondary | ICD-10-CM | POA: Diagnosis not present

## 2023-03-06 DIAGNOSIS — Z13220 Encounter for screening for lipoid disorders: Secondary | ICD-10-CM | POA: Diagnosis not present

## 2023-03-06 DIAGNOSIS — Z131 Encounter for screening for diabetes mellitus: Secondary | ICD-10-CM | POA: Diagnosis not present

## 2023-03-06 DIAGNOSIS — E039 Hypothyroidism, unspecified: Secondary | ICD-10-CM | POA: Diagnosis not present

## 2023-03-06 DIAGNOSIS — Z125 Encounter for screening for malignant neoplasm of prostate: Secondary | ICD-10-CM | POA: Diagnosis not present

## 2023-03-06 DIAGNOSIS — E559 Vitamin D deficiency, unspecified: Secondary | ICD-10-CM | POA: Diagnosis not present

## 2023-03-13 DIAGNOSIS — I10 Essential (primary) hypertension: Secondary | ICD-10-CM | POA: Diagnosis not present

## 2023-03-13 DIAGNOSIS — E039 Hypothyroidism, unspecified: Secondary | ICD-10-CM | POA: Diagnosis not present

## 2023-03-13 DIAGNOSIS — Z9049 Acquired absence of other specified parts of digestive tract: Secondary | ICD-10-CM | POA: Diagnosis not present

## 2023-03-13 DIAGNOSIS — L409 Psoriasis, unspecified: Secondary | ICD-10-CM | POA: Diagnosis not present

## 2023-03-13 DIAGNOSIS — G4700 Insomnia, unspecified: Secondary | ICD-10-CM | POA: Diagnosis not present

## 2023-03-13 DIAGNOSIS — I2510 Atherosclerotic heart disease of native coronary artery without angina pectoris: Secondary | ICD-10-CM | POA: Diagnosis not present

## 2023-03-13 DIAGNOSIS — L989 Disorder of the skin and subcutaneous tissue, unspecified: Secondary | ICD-10-CM | POA: Diagnosis not present

## 2023-03-13 DIAGNOSIS — Z0000 Encounter for general adult medical examination without abnormal findings: Secondary | ICD-10-CM | POA: Diagnosis not present

## 2023-03-13 DIAGNOSIS — K769 Liver disease, unspecified: Secondary | ICD-10-CM | POA: Diagnosis not present

## 2023-05-14 DIAGNOSIS — H5203 Hypermetropia, bilateral: Secondary | ICD-10-CM | POA: Diagnosis not present
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
## 2023-05-21 IMAGING — RF DG ERCP WO/W SPHINCTEROTOMY
1 series · 2 of 2 positions shown · non-contrast
Comparison: None.

CLINICAL DATA: 80-year-old male with a history of biliary
obstruction

EXAM:
ERCP
TECHNIQUE: Multiple spot images obtained with the fluoroscopic device and
submitted for interpretation post-procedure.
FLUOROSCOPY TIME:  Fluoroscopy Time:  1 minutes 58 seconds

[Series 1: run · 2 of 2 slices shown]
[im 1/2]
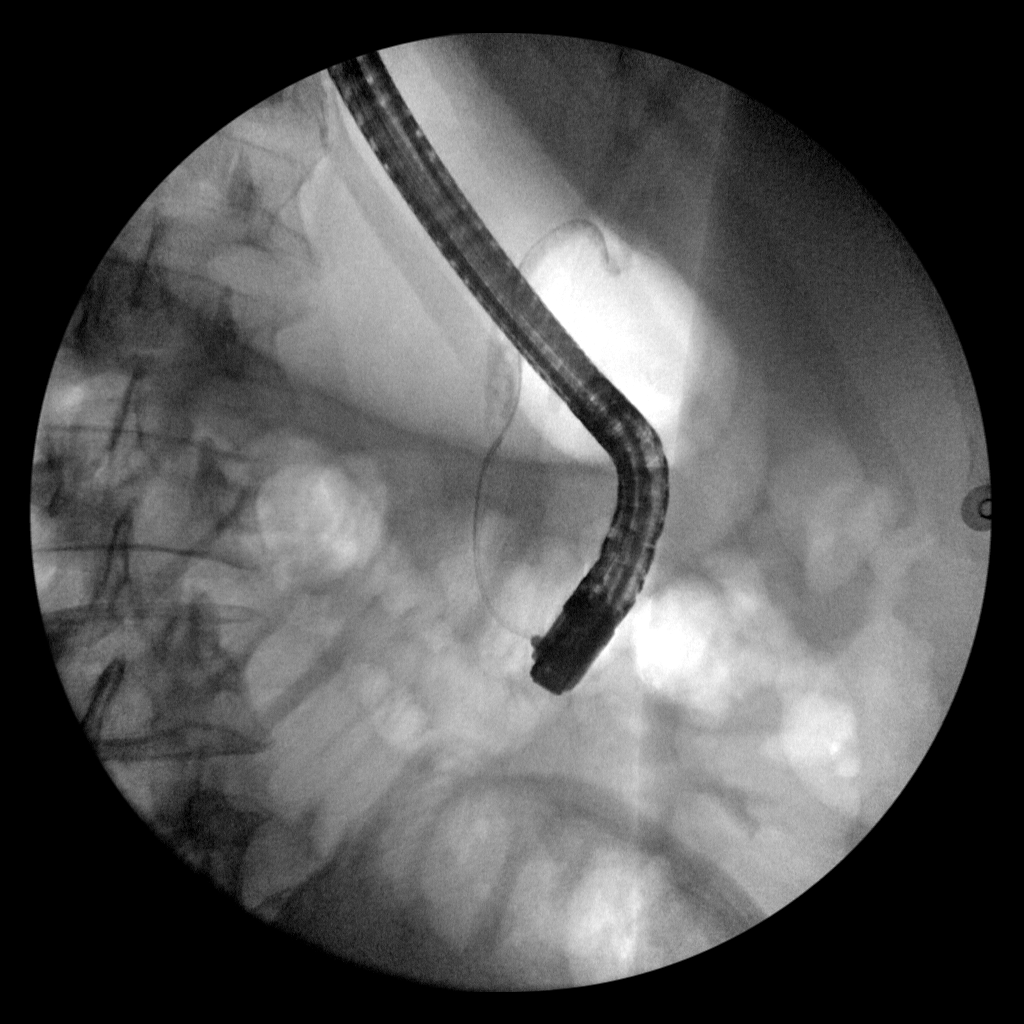
[im 2/2]
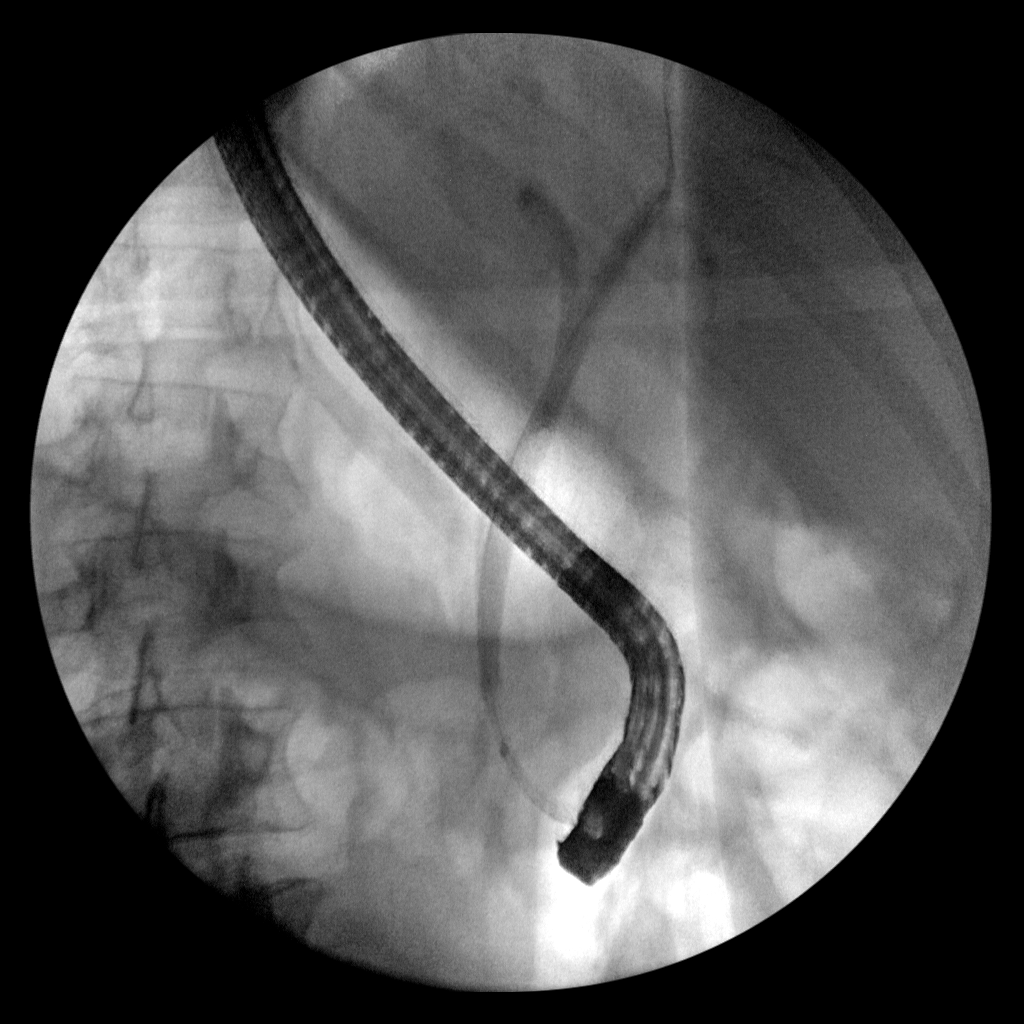

[2 of 2 positions shown; findings below may reference images not displayed]

FINDINGS: Limited intraoperative fluoroscopic spot images during ERCP.

Initial image demonstrates endoscope projecting over the upper
abdomen with safety wire position and partial opacification of the
extrahepatic biliary ducts. Final image demonstrates partial
opacification of the extrahepatic biliary ducts.
IMPRESSION: Limited images during ERCP, as above. Please refer to the dictated
operative report for full details of intraoperative findings and
procedure.

## 2023-07-23 DIAGNOSIS — J019 Acute sinusitis, unspecified: Secondary | ICD-10-CM | POA: Diagnosis not present

## 2023-07-23 DIAGNOSIS — Z6822 Body mass index (BMI) 22.0-22.9, adult: Secondary | ICD-10-CM | POA: Diagnosis not present

## 2023-07-23 DIAGNOSIS — J209 Acute bronchitis, unspecified: Secondary | ICD-10-CM | POA: Diagnosis not present

## 2023-07-23 DIAGNOSIS — Z20828 Contact with and (suspected) exposure to other viral communicable diseases: Secondary | ICD-10-CM | POA: Diagnosis not present

## 2023-07-23 DIAGNOSIS — Z2089 Contact with and (suspected) exposure to other communicable diseases: Secondary | ICD-10-CM | POA: Diagnosis not present

## 2023-07-25 ENCOUNTER — Encounter (INDEPENDENT_AMBULATORY_CARE_PROVIDER_SITE_OTHER): Payer: Self-pay | Admitting: Gastroenterology

## 2023-08-25 ENCOUNTER — Encounter: Payer: Self-pay | Admitting: *Deleted

## 2023-08-26 ENCOUNTER — Ambulatory Visit: Admitting: Cardiology

## 2023-08-28 ENCOUNTER — Encounter: Payer: Self-pay | Admitting: Nurse Practitioner

## 2023-08-28 ENCOUNTER — Ambulatory Visit: Attending: Nurse Practitioner | Admitting: Nurse Practitioner

## 2023-08-28 VITALS — BP 130/80 | HR 69 | Ht 72.0 in | Wt 170.0 lb

## 2023-08-28 DIAGNOSIS — I6523 Occlusion and stenosis of bilateral carotid arteries: Secondary | ICD-10-CM

## 2023-08-28 DIAGNOSIS — I2510 Atherosclerotic heart disease of native coronary artery without angina pectoris: Secondary | ICD-10-CM | POA: Diagnosis not present

## 2023-08-28 DIAGNOSIS — I10 Essential (primary) hypertension: Secondary | ICD-10-CM

## 2023-08-28 NOTE — Patient Instructions (Addendum)
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
## 2023-09-08 ENCOUNTER — Ambulatory Visit (INDEPENDENT_AMBULATORY_CARE_PROVIDER_SITE_OTHER): Payer: Medicare HMO | Admitting: Gastroenterology

## 2023-09-15 DIAGNOSIS — Z0001 Encounter for general adult medical examination with abnormal findings: Secondary | ICD-10-CM | POA: Diagnosis not present

## 2023-09-15 DIAGNOSIS — K769 Liver disease, unspecified: Secondary | ICD-10-CM | POA: Diagnosis not present

## 2023-09-15 DIAGNOSIS — E039 Hypothyroidism, unspecified: Secondary | ICD-10-CM | POA: Diagnosis not present

## 2023-09-15 DIAGNOSIS — I10 Essential (primary) hypertension: Secondary | ICD-10-CM | POA: Diagnosis not present

## 2023-09-17 ENCOUNTER — Ambulatory Visit: Payer: Medicare HMO | Admitting: Cardiology

## 2023-09-18 ENCOUNTER — Ambulatory Visit (INDEPENDENT_AMBULATORY_CARE_PROVIDER_SITE_OTHER): Payer: Medicare HMO | Admitting: Gastroenterology

## 2023-09-23 ENCOUNTER — Ambulatory Visit: Admitting: Student

## 2023-10-07 ENCOUNTER — Ambulatory Visit: Admitting: Nurse Practitioner

## 2023-11-19 ENCOUNTER — Ambulatory Visit (INDEPENDENT_AMBULATORY_CARE_PROVIDER_SITE_OTHER): Admitting: Otolaryngology

## 2023-11-19 ENCOUNTER — Encounter (INDEPENDENT_AMBULATORY_CARE_PROVIDER_SITE_OTHER): Payer: Self-pay | Admitting: Otolaryngology

## 2023-11-19 VITALS — BP 178/74 | HR 66 | Ht 72.0 in | Wt 175.0 lb

## 2023-11-19 DIAGNOSIS — R0982 Postnasal drip: Secondary | ICD-10-CM | POA: Diagnosis not present

## 2023-11-19 DIAGNOSIS — K219 Gastro-esophageal reflux disease without esophagitis: Secondary | ICD-10-CM | POA: Diagnosis not present

## 2023-11-19 MED ORDER — SALINE SPRAY 0.65 % NA SOLN: 2.0000 | Freq: Every day | NASAL | 10 refills | Status: AC

## 2023-11-19 MED ORDER — IPRATROPIUM BROMIDE 0.03 % NA SOLN: 2.0000 | Freq: Every day | NASAL | 12 refills | Status: AC
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
## 2023-11-19 NOTE — Patient Instructions (Signed)
 Use saline spray in the nose two sprays each nostril before bed; and right after, use atrovent  nasal spray two sprays each nostril before bed

## 2023-12-18 DIAGNOSIS — Z6822 Body mass index (BMI) 22.0-22.9, adult: Secondary | ICD-10-CM | POA: Diagnosis not present

## 2023-12-18 DIAGNOSIS — J019 Acute sinusitis, unspecified: Secondary | ICD-10-CM | POA: Diagnosis not present

## 2024-01-23 ENCOUNTER — Telehealth: Payer: Self-pay | Admitting: Cardiology

## 2024-01-23 NOTE — Telephone Encounter (Signed)
 Called pt to schedule follow up with Dr.McDowell. Pt stated he is not interested in seeing a cardiologist any more he does not need it.

## 2024-03-10 DIAGNOSIS — R7303 Prediabetes: Secondary | ICD-10-CM | POA: Diagnosis not present

## 2024-03-10 DIAGNOSIS — K769 Liver disease, unspecified: Secondary | ICD-10-CM | POA: Diagnosis not present

## 2024-03-10 DIAGNOSIS — E559 Vitamin D deficiency, unspecified: Secondary | ICD-10-CM | POA: Diagnosis not present

## 2024-03-10 DIAGNOSIS — E039 Hypothyroidism, unspecified: Secondary | ICD-10-CM | POA: Diagnosis not present

## 2024-03-10 DIAGNOSIS — D559 Anemia due to enzyme disorder, unspecified: Secondary | ICD-10-CM | POA: Diagnosis not present

## 2024-03-10 DIAGNOSIS — Z13220 Encounter for screening for lipoid disorders: Secondary | ICD-10-CM | POA: Diagnosis not present

## 2024-03-10 DIAGNOSIS — Z131 Encounter for screening for diabetes mellitus: Secondary | ICD-10-CM | POA: Diagnosis not present

## 2024-03-10 DIAGNOSIS — Z0001 Encounter for general adult medical examination with abnormal findings: Secondary | ICD-10-CM | POA: Diagnosis not present

## 2024-03-10 DIAGNOSIS — I10 Essential (primary) hypertension: Secondary | ICD-10-CM | POA: Diagnosis not present

## 2024-03-16 DIAGNOSIS — Z6822 Body mass index (BMI) 22.0-22.9, adult: Secondary | ICD-10-CM | POA: Diagnosis not present

## 2024-03-16 DIAGNOSIS — I10 Essential (primary) hypertension: Secondary | ICD-10-CM | POA: Diagnosis not present

## 2024-03-16 DIAGNOSIS — L409 Psoriasis, unspecified: Secondary | ICD-10-CM | POA: Diagnosis not present

## 2024-03-16 DIAGNOSIS — I2510 Atherosclerotic heart disease of native coronary artery without angina pectoris: Secondary | ICD-10-CM | POA: Diagnosis not present

## 2024-03-16 DIAGNOSIS — L989 Disorder of the skin and subcutaneous tissue, unspecified: Secondary | ICD-10-CM | POA: Diagnosis not present

## 2024-03-16 DIAGNOSIS — Z0001 Encounter for general adult medical examination with abnormal findings: Secondary | ICD-10-CM | POA: Diagnosis not present

## 2024-04-07 ENCOUNTER — Encounter (INDEPENDENT_AMBULATORY_CARE_PROVIDER_SITE_OTHER): Payer: Self-pay | Admitting: Gastroenterology

## 2024-04-23 DIAGNOSIS — Z23 Encounter for immunization: Secondary | ICD-10-CM | POA: Diagnosis not present
